# Patient Record
Sex: Female | Born: 1960 | Race: White | Hispanic: No | Marital: Married | Smoking: Never smoker
Health system: Southern US, Community
[De-identification: ages and names within clinical notes are randomized; demographics above are authoritative.]

## PROBLEM LIST (undated history)

## (undated) DIAGNOSIS — G4730 Sleep apnea, unspecified: Secondary | ICD-10-CM

## (undated) DIAGNOSIS — R002 Palpitations: Secondary | ICD-10-CM

## (undated) DIAGNOSIS — K219 Gastro-esophageal reflux disease without esophagitis: Secondary | ICD-10-CM

## (undated) DIAGNOSIS — I493 Ventricular premature depolarization: Secondary | ICD-10-CM

## (undated) HISTORY — PX: ENDOMETRIAL ABLATION: SHX621

---
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
## 1998-10-19 ENCOUNTER — Other Ambulatory Visit: Admission: RE | Admit: 1998-10-19 | Discharge: 1998-10-19 | Payer: Self-pay | Admitting: Cardiology

## 1999-10-24 ENCOUNTER — Other Ambulatory Visit: Admission: RE | Admit: 1999-10-24 | Discharge: 1999-10-24 | Payer: Self-pay | Admitting: Internal Medicine

## 1999-12-31 ENCOUNTER — Encounter: Payer: Self-pay | Admitting: Internal Medicine

## 1999-12-31 ENCOUNTER — Encounter: Admission: RE | Admit: 1999-12-31 | Discharge: 1999-12-31 | Payer: Self-pay | Admitting: Internal Medicine

## 2000-01-01 ENCOUNTER — Encounter: Payer: Self-pay | Admitting: Internal Medicine

## 2000-01-01 ENCOUNTER — Encounter: Admission: RE | Admit: 2000-01-01 | Discharge: 2000-01-01 | Payer: Self-pay | Admitting: Internal Medicine

## 2000-10-09 ENCOUNTER — Other Ambulatory Visit: Admission: RE | Admit: 2000-10-09 | Discharge: 2000-10-09 | Payer: Self-pay | Admitting: Internal Medicine

## 2000-11-18 ENCOUNTER — Encounter: Payer: Self-pay | Admitting: Internal Medicine

## 2000-11-18 ENCOUNTER — Encounter: Admission: RE | Admit: 2000-11-18 | Discharge: 2000-11-18 | Payer: Self-pay | Admitting: Internal Medicine

## 2001-01-15 ENCOUNTER — Encounter: Admission: RE | Admit: 2001-01-15 | Discharge: 2001-01-15 | Payer: Self-pay | Admitting: Internal Medicine

## 2001-01-15 ENCOUNTER — Encounter: Payer: Self-pay | Admitting: Internal Medicine

## 2001-10-22 ENCOUNTER — Other Ambulatory Visit: Admission: RE | Admit: 2001-10-22 | Discharge: 2001-10-22 | Payer: Self-pay | Admitting: Internal Medicine

## 2002-03-04 ENCOUNTER — Encounter: Payer: Self-pay | Admitting: Internal Medicine

## 2002-03-04 ENCOUNTER — Encounter: Admission: RE | Admit: 2002-03-04 | Discharge: 2002-03-04 | Payer: Self-pay | Admitting: Internal Medicine

## 2003-04-14 ENCOUNTER — Encounter: Admission: RE | Admit: 2003-04-14 | Discharge: 2003-04-14 | Payer: Self-pay | Admitting: Internal Medicine

## 2003-04-14 ENCOUNTER — Encounter: Payer: Self-pay | Admitting: Internal Medicine

## 2004-04-08 ENCOUNTER — Ambulatory Visit (HOSPITAL_COMMUNITY): Admission: RE | Admit: 2004-04-08 | Discharge: 2004-04-08 | Payer: Self-pay | Admitting: Gastroenterology

## 2004-04-08 ENCOUNTER — Encounter (INDEPENDENT_AMBULATORY_CARE_PROVIDER_SITE_OTHER): Payer: Self-pay | Admitting: *Deleted

## 2004-05-07 ENCOUNTER — Ambulatory Visit (HOSPITAL_COMMUNITY): Admission: RE | Admit: 2004-05-07 | Discharge: 2004-05-07 | Payer: Self-pay | Admitting: Gastroenterology

## 2004-09-13 ENCOUNTER — Ambulatory Visit (HOSPITAL_COMMUNITY): Admission: RE | Admit: 2004-09-13 | Discharge: 2004-09-13 | Payer: Self-pay | Admitting: Obstetrics & Gynecology

## 2004-09-13 ENCOUNTER — Encounter (INDEPENDENT_AMBULATORY_CARE_PROVIDER_SITE_OTHER): Payer: Self-pay | Admitting: *Deleted

## 2004-10-25 ENCOUNTER — Ambulatory Visit (HOSPITAL_COMMUNITY): Admission: RE | Admit: 2004-10-25 | Discharge: 2004-10-25 | Payer: Self-pay | Admitting: Internal Medicine

## 2005-11-28 ENCOUNTER — Ambulatory Visit (HOSPITAL_COMMUNITY): Admission: RE | Admit: 2005-11-28 | Discharge: 2005-11-28 | Payer: Self-pay | Admitting: Internal Medicine

## 2006-02-10 ENCOUNTER — Emergency Department (HOSPITAL_COMMUNITY): Admission: EM | Admit: 2006-02-10 | Discharge: 2006-02-11 | Payer: Self-pay | Admitting: Emergency Medicine

## 2006-12-02 ENCOUNTER — Ambulatory Visit (HOSPITAL_COMMUNITY): Admission: RE | Admit: 2006-12-02 | Discharge: 2006-12-02 | Payer: Self-pay | Admitting: Internal Medicine

## 2007-12-21 ENCOUNTER — Ambulatory Visit (HOSPITAL_COMMUNITY): Admission: RE | Admit: 2007-12-21 | Discharge: 2007-12-21 | Payer: Self-pay | Admitting: Internal Medicine

## 2009-12-07 ENCOUNTER — Ambulatory Visit (HOSPITAL_COMMUNITY): Admission: RE | Admit: 2009-12-07 | Discharge: 2009-12-07 | Payer: Self-pay | Admitting: Internal Medicine

## 2009-12-29 HISTORY — PX: HYSTEROSCOPY: SHX211

## 2010-12-27 ENCOUNTER — Ambulatory Visit (HOSPITAL_COMMUNITY)
Admission: RE | Admit: 2010-12-27 | Discharge: 2010-12-27 | Payer: Self-pay | Source: Home / Self Care | Attending: Internal Medicine | Admitting: Internal Medicine

## 2011-05-16 NOTE — Op Note (Signed)
NAME:  Shelly Moran, Shelly Moran                        ACCOUNT NO.:  [id number]   MEDICAL RECORD NO.:  [id number]                   PATIENT TYPE:  AMB   LOCATION:  ENDO                                 FACILITY:  [facility]   PHYSICIAN:  Anselmo Rod, M.D.               DATE OF BIRTH:  September 28, 1961   DATE OF PROCEDURE:  04/08/2004  DATE OF DISCHARGE:                                 OPERATIVE REPORT   PROCEDURE PERFORMED:  Colonoscopy with cold biopsies times six.   ENDOSCOPIST:  Charna Elizabeth, M.D.   INSTRUMENT USED:  Olympus video colonoscope.   INDICATIONS FOR PROCEDURE:  The patient is a 50 year old white female  undergoing colonoscopy for iron deficiency anemia.  Rule out colonic polyps,  masses, etc.   PREPROCEDURE PREPARATION:  Informed consent was procured from the patient.  The patient was fasted for eight hours prior to the procedure and prepped  with a bottle of magnesium citrate and a gallon of GoLYTELY the night prior  to the procedure.   PREPROCEDURE PHYSICAL:  The patient had stable vital signs.  Neck supple.  Chest clear to auscultation.  S1 and S2 regular.  Abdomen soft with normal  bowel sounds.   DESCRIPTION OF PROCEDURE:  The patient was placed in left lateral decubitus  position and sedated with an additional 20 mg of Demerol and 2 mg of Versed  intravenously.  Once the patient was adequately sedated and maintained on  low flow oxygen and continuous cardiac monitoring, the Olympus video  colonoscope was advanced from the cecum.  The terminal ileum appeared  healthy and without lesions.  Six small sessile polyps were biopsied from  the cecal base.  The appendicular orifice and ileocecal valve were clearly  visualized and photographed.  Retroflexion in the rectum revealed no  abnormalities.  There was no evidence of diverticulosis.  The patient  tolerated the procedure well without immediate complications.   IMPRESSION:  Six small sessile polyps biopsied from the cecal  base,  otherwise normal colonoscopy up to the terminal ileum.   RECOMMENDATIONS:  1. Await pathology results.  2. Avoid all nonsteroidals for now.  3. Resume iron supplements.  4. Outpatient followup in the next two weeks for further recommendations.                                               Anselmo Rod, M.D.    JNM/MEDQ  D:  04/08/2004  T:  04/08/2004  Job:  [id number]   cc:   Merlene Laughter. Renae Gloss, M.D.  [address]  [address]  [place]  [place] [address]  Fax: [phone]

## 2011-05-16 NOTE — Op Note (Signed)
NAME:  Shelly Moran, Shelly Moran                        ACCOUNT NO.:  [id number]   MEDICAL RECORD NO.:  [id number]                   PATIENT TYPE:  AMB   LOCATION:  ENDO                                 FACILITY:  [facility]   PHYSICIAN:  Anselmo Rod, M.D.               DATE OF BIRTH:  12/15/1961   DATE OF PROCEDURE:  04/08/2004  DATE OF DISCHARGE:                                 OPERATIVE REPORT   PROCEDURE:  Esophagogastroduodenoscopy with small bowel biopsies.   ENDOSCOPIST:  Anselmo Rod, M.D.   INSTRUMENT USED:  Olympus video panendoscope.   INDICATIONS FOR PROCEDURE:  [age]-year-old white female with a history  of iron deficiency anemia.  Rule out peptic ulcer disease, esophagitis,  gastritis, etc.   PRE-PROCEDURE PREPARATION:  Informed consent was procured from the patient.  The patient had fasted for eight hours prior to the procedure.   PRE-PROCEDURE PHYSICAL:  VITAL SIGNS:  Stable vital signs.  NECK:  Supple.  CHEST:  Clear to auscultation.  CARDIOVASCULAR:  S1 and S2 are regular.  ABDOMEN:  Soft with normal bowel sounds.   DESCRIPTION OF PROCEDURE:  The patient was placed in the left lateral  decubitus position and sedated with 60 mg of Demerol and 7 mg of Versed  intravenously.  Once the patient was adequately sedated and maintained on  low flow oxygen and continuous cardiac monitoring the Olympus video  panendoscope was advanced through the mouth piece, over the tongue and into  the esophagus under direct vision.  The entire esophagus appeared normal  with no evidence of ring, stricture, masses, esophagitis or Barrett's  mucosa.  A small hiatal hernia was seen on high retroflexion.  The rest of  the gastric mucosa in the proximal small bowel appeared normal.  Small bowel  biopsies were done to rule out celiac sprue given the patient's history of  iron deficiency anemia.   IMPRESSION:  Small hiatal hernia, otherwise normal  esophagogastroduodenoscopy.   RECOMMENDATIONS:  1. Await pathology results.  2. Avoid non-steroidals.  3. Proceed with colonoscopy at this time.  4. Further recommendations will be made thereafter.                                               Anselmo Rod, M.D.    JNM/MEDQ  D:  04/08/2004  T:  04/08/2004  Job:  [id number]   cc:   Merlene Laughter. Renae Gloss, M.D.  [address]  [address]  [place]  [place] [address]  Fax: [phone]

## 2011-05-16 NOTE — H&P (Signed)
NAME:  Shelly Moran, Shelly Moran NO.:  [id number]   MEDICAL RECORD NO.:  [id number]                   PATIENT TYPE:   LOCATION:                                       FACILITY:   PHYSICIAN:  Roseanna Rainbow, M.D.         DATE OF BIRTH:   DATE OF ADMISSION:  DATE OF DISCHARGE:                                HISTORY & PHYSICAL   CHIEF COMPLAINT:  The patient is a 50 year old Caucasian female with  menorrhagia who presents for [facility] hysteroscopy.   HISTORY OF PRESENT ILLNESS:  The patient gives a 76-month history of  menorrhagia.  She denies any intermenstrual bleeding or pain.  She also  denies any change in her bowel or bladder habits.  She has secondary anemia.  Workup to date has included an ultrasound from May 2005 that demonstrated a  thickened endometrial stripe approximately 1.9 cm.  She has a normal Pap  smear from March 2005.  She denies any history of any uterine pathology.   PAST OBSTETRICAL AND GYNECOLOGICAL HISTORY:  She is status post three NSVDs.  She has had one spontaneous abortion.   PAST MEDICAL HISTORY:  Iron deficiency anemia, GERD, arrhythmia, anxiety  disorder, depression.   PAST SURGICAL HISTORY:  T&A.   ALLERGIES:  No known drug allergies.   MEDICATIONS:  Paxil, Prevacid, Toprol-XL, and Clarinex.   FAMILY HISTORY:  Osteoporosis, myocardial infarction, hypertension.   SOCIAL HISTORY:  She is employed as an Government social research officer.  She is married.  She denies any tobacco, ethanol, or substance abuse.   PHYSICAL EXAMINATION:  VITAL SIGNS:  Temperature 98.5, pulse 70, blood  pressure 161/84, weight 167 pounds.  GENERAL:  Well-developed, well-nourished, no apparent distress.  LUNGS:  Clear to auscultation bilaterally.  HEART:  Regular rate and rhythm.  ABDOMEN:  Soft, nontender, no organomegaly.  PELVIC:  BUS within normal limits.  On speculum exam the vagina is clean.  On bimanual exam the uterus is small, anteverted, nontender.  The  adnexa are  nonpalpable and nontender.   ASSESSMENT:  Menorrhagia; rule out endometrial polyp, endometrial  hyperplasia, or a neoplastic process.   PLAN:  Diagnostic D&C hysteroscopy, possible operative hysteroscopy.  The  risks, benefits, and alternative forms of management were reviewed with the  patient and informed consent was obtained.                                               Roseanna Rainbow, M.D.    Judee Clara  D:  08/14/2004  T:  08/14/2004  Job:  [id number]

## 2011-05-16 NOTE — Op Note (Signed)
NAME:  Shelly Moran, Shelly Moran                        ACCOUNT NO.:  [id number]   MEDICAL RECORD NO.:  [id number]                   PATIENT TYPE:  AMB   LOCATION:  SDC                                  FACILITY:  [facility]   PHYSICIAN:  Roseanna Rainbow, M.D.         DATE OF BIRTH:  1961/06/24   DATE OF PROCEDURE:  09/13/2004  DATE OF DISCHARGE:                                 OPERATIVE REPORT   PREOPERATIVE DIAGNOSES:  Abnormal uterine bleeding.   POSTOPERATIVE DIAGNOSES:  Abnormal uterine bleeding.   PROCEDURE:  Diagnostic hysteroscopy and dilatation and curettage.   SURGEON:  Roseanna Rainbow, M.D.   ANESTHESIA:  Managed anesthesia care, paracervical block.   IV FLUIDS:  As per anesthesiology.   ESTIMATED BLOOD LOSS:  50 mL.   COMPLICATIONS:  None.   DESCRIPTION OF PROCEDURE:  The patient was taken to the operating room. She  was placed in the dorsal lithotomy position and prepped and draped in the  usual sterile fashion. Retractors were placed into the vagina. The anterior  lip of the cervix was infiltrated with 2 mL of 1% lidocaine.  The single  tooth tenaculum was then applied to this location.  5 mL of 1% lidocaine  were injected at 5 and 7 o'clock to produce a paracervical block. The cervix  was then dilated with [facility] dilators. The diagnostic hysteroscope was then  introduced into the cervical canal and into the uterine fundus.  A survey  was then taken of the uterine sidewalls and ostia.  The endometrium appeared  somewhat polypoid in appearance, however, there were no discreet lesions  noted.  Glycine was the distending medium used, there was a minimal deficit.  The hysteroscope was then removed.  Please note the uterus was retroverted.  The uterus sounded to 9 cm.  A sharp curettage was then performed.  The  single tooth tenaculum was then removed with minimal bleeding noted from the  cervix.  At the close of the procedure, the instrument and pack counts were  said to  be correct x2. The patient was taken to the PACU awake and in stable  condition.   PATHOLOGY:  Endometrial curettings.                                               Roseanna Rainbow, M.D.    Judee Clara  D:  09/13/2004  T:  09/14/2004  Job:  [id number]

## 2012-01-22 ENCOUNTER — Other Ambulatory Visit (HOSPITAL_COMMUNITY): Payer: Self-pay | Admitting: Internal Medicine

## 2012-01-22 DIAGNOSIS — Z1231 Encounter for screening mammogram for malignant neoplasm of breast: Secondary | ICD-10-CM

## 2012-02-20 ENCOUNTER — Ambulatory Visit (HOSPITAL_COMMUNITY)
Admission: RE | Admit: 2012-02-20 | Discharge: 2012-02-20 | Disposition: A | Discharge status: Home / Self Care | Source: Ambulatory Visit | Attending: Internal Medicine | Admitting: Internal Medicine

## 2012-02-20 DIAGNOSIS — Z1231 Encounter for screening mammogram for malignant neoplasm of breast: Secondary | ICD-10-CM | POA: Insufficient documentation

## 2012-02-20 IMAGING — MG MM DIGITAL SCREENING BILAT
6 series · 6 of 6 positions shown · non-contrast
Comparison: none

DG SCREEN MAMMOGRAM BILATERAL
Bilateral CC and MLO view(s) were taken.

DIGITAL SCREENING MAMMOGRAM WITH CAD:
The breast tissue is extremely dense.  No masses or malignant type calcifications are identified.  
Compared with prior studies.
Images were processed with CAD.

[R CC]
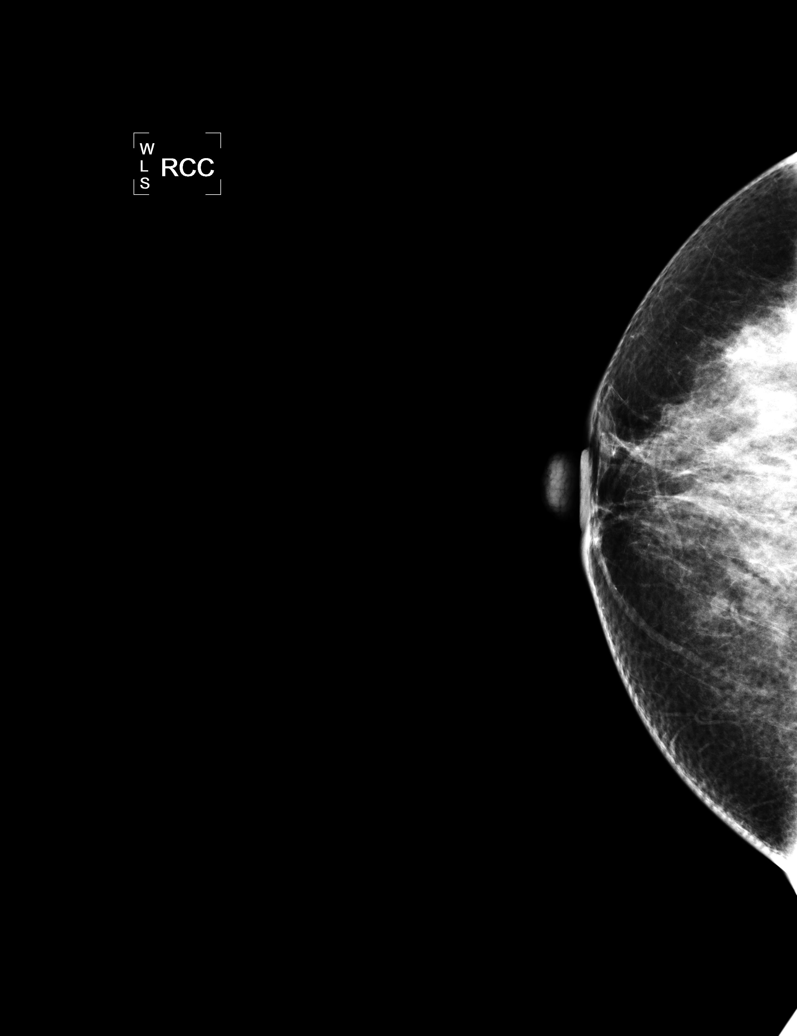

[R MLO (1 of 2)]
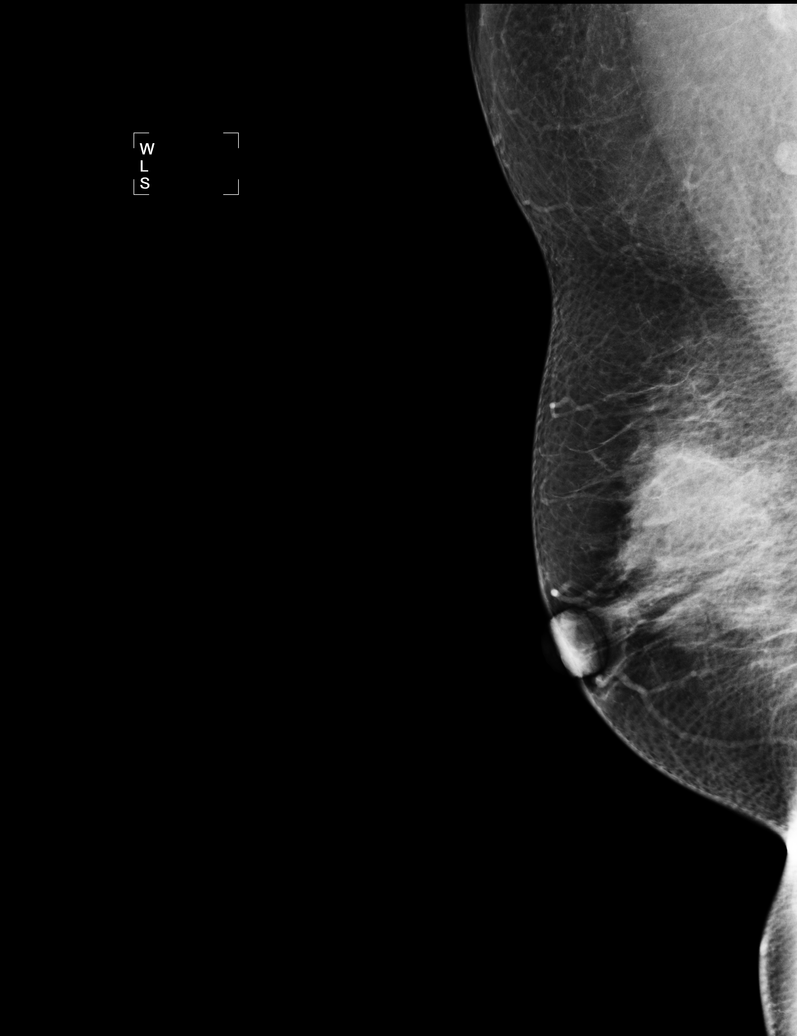

[L CC (1 of 2)]
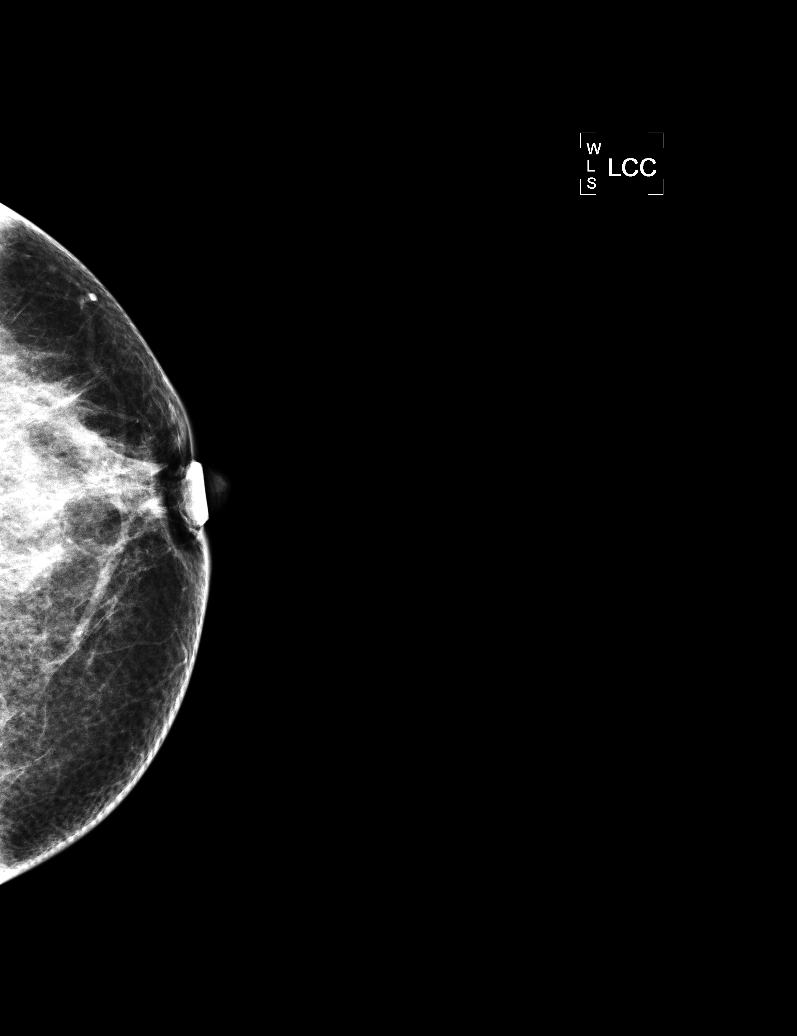

[L MLO]
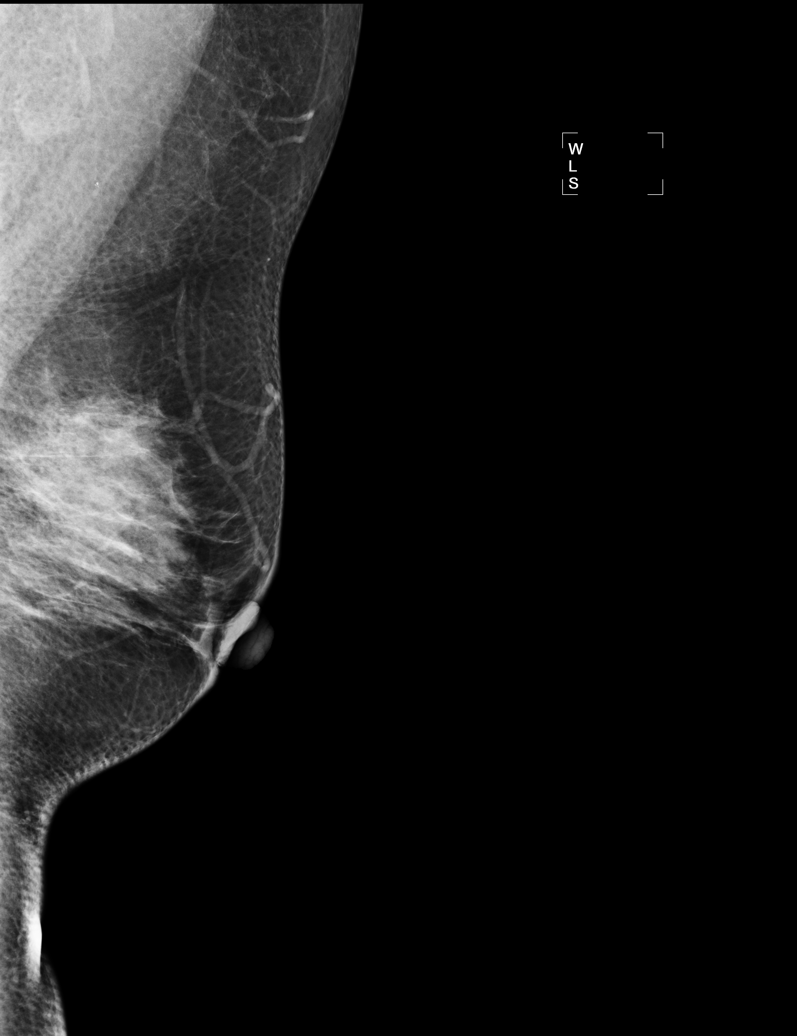

[L CC (2 of 2)]
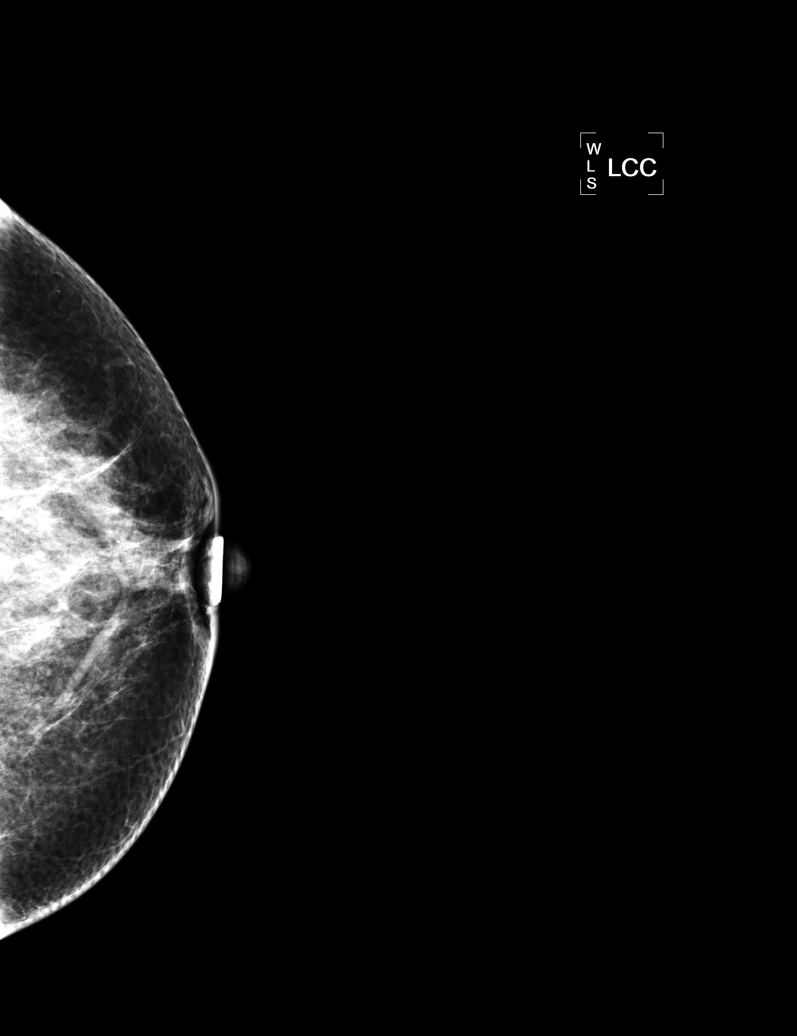

[R MLO (2 of 2)]
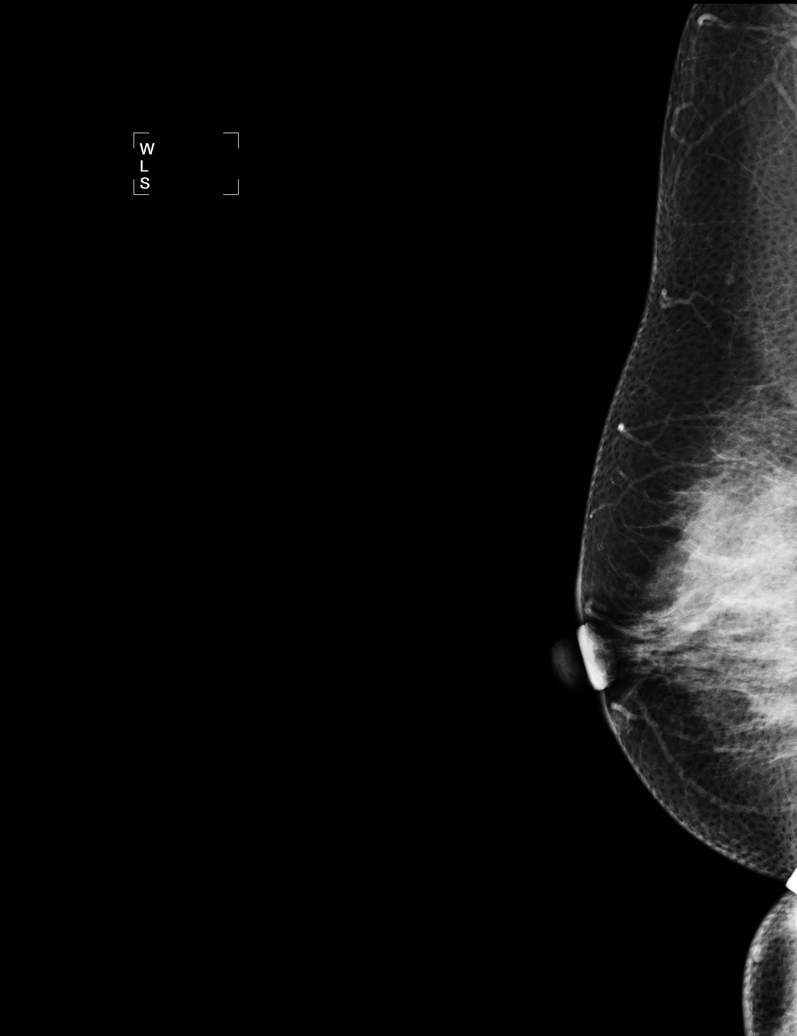

[6 of 6 positions shown; findings below may reference images not displayed]

IMPRESSION: No specific mammographic evidence of malignancy.  Next screening mammogram is recommended in one 
year.

A result letter of this screening mammogram will be mailed directly to the patient.

ASSESSMENT: Negative - BI-RADS 1

Screening mammogram and screening mammogram in 1 year.
,

## 2013-03-22 ENCOUNTER — Other Ambulatory Visit: Payer: Self-pay | Admitting: *Deleted

## 2013-03-25 ENCOUNTER — Other Ambulatory Visit (HOSPITAL_COMMUNITY): Payer: Self-pay | Admitting: Internal Medicine

## 2013-03-25 DIAGNOSIS — Z1231 Encounter for screening mammogram for malignant neoplasm of breast: Secondary | ICD-10-CM

## 2013-03-29 ENCOUNTER — Ambulatory Visit (HOSPITAL_COMMUNITY)

## 2013-03-30 ENCOUNTER — Ambulatory Visit (HOSPITAL_COMMUNITY)
Admission: RE | Admit: 2013-03-30 | Discharge: 2013-03-30 | Disposition: A | Discharge status: Home / Self Care | Source: Ambulatory Visit | Attending: Internal Medicine | Admitting: Internal Medicine

## 2013-03-30 DIAGNOSIS — Z1231 Encounter for screening mammogram for malignant neoplasm of breast: Secondary | ICD-10-CM

## 2013-07-13 ENCOUNTER — Ambulatory Visit (HOSPITAL_BASED_OUTPATIENT_CLINIC_OR_DEPARTMENT_OTHER): Attending: Internal Medicine | Admitting: Radiology

## 2013-07-13 VITALS — Ht 68.0 in | Wt 169.0 lb

## 2013-07-13 DIAGNOSIS — G4710 Hypersomnia, unspecified: Secondary | ICD-10-CM

## 2013-07-13 DIAGNOSIS — R0683 Snoring: Secondary | ICD-10-CM

## 2013-07-13 DIAGNOSIS — G4733 Obstructive sleep apnea (adult) (pediatric): Secondary | ICD-10-CM | POA: Insufficient documentation

## 2013-07-16 DIAGNOSIS — R0989 Other specified symptoms and signs involving the circulatory and respiratory systems: Secondary | ICD-10-CM

## 2013-07-16 DIAGNOSIS — G4733 Obstructive sleep apnea (adult) (pediatric): Secondary | ICD-10-CM

## 2013-07-16 DIAGNOSIS — R0609 Other forms of dyspnea: Secondary | ICD-10-CM

## 2013-07-16 NOTE — Procedures (Signed)
NAME:  Shelly Moran, SOZA              ACCOUNT NO.:  [id number]  MEDICAL RECORD NO.:  [id number]          PATIENT TYPE:  OUT  LOCATION:  SLEEP CENTER                 FACILITY:  [facility]  PHYSICIAN:  Iman Reinertsen D. Maple Hudson, MD, FCCP, FACPDATE OF BIRTH:  [birth year]  DATE OF STUDY:  07/13/2013                           NOCTURNAL POLYSOMNOGRAM  REFERRING PHYSICIAN:  SCOTT HOLWERDA  INDICATION FOR STUDY:  Hypersomnia with sleep apnea.  EPWORTH SLEEPINESS SCORE:  14/24.  BMI 25.7, weight 169 pounds, height 68 inches, neck 14.5 inches.  MEDICATIONS:  Home medications are charted for review.  SLEEP ARCHITECTURE:  Total sleep time 214.5 minutes with sleep efficiency 56.7%.  Stage I was 30.1%, stage II 69.2%, stage III was absent, REM 0.7% of total sleep time.  Sleep latency 57.5 minutes, REM latency 204.5 minutes.  Awake after sleep onset 107.5 minutes.  Arousal index 35.8.  Bedtime Medication:  None.  Sleep pattern was marked by difficulty initiating and maintaining sleep until nearly 1:30 a.m. and by frequent spontaneous awakenings thereafter.  RESPIRATORY DATA:  Apnea-hypopnea index (AHI) 31.6 per hour.  A total of 113 events was scored including 64 obstructive apneas, 2 mixed apneas, 47 hypopneas.  Events were more frequent during supine sleep position and during REM.  REM/AHI 40 per hour.  This study was ordered as a diagnostic NPSG protocol without CPAP.  Most events occurred after 2 a.m. when she finally began to sustain sleep.  OXYGEN DATA:  Moderately loud snoring with oxygen desaturation to a nadir of 86% and mean oxygen saturation through the study of 95.6% on room air.  CARDIAC DATA:  Sinus rhythm with rare PVC.  MOVEMENT-PARASOMNIA:  No significant movement disturbance.  Bathroom x1.  IMPRESSIONS-RECOMMENDATIONS: 1. Difficulty initiating and maintaining sleep until after 1:30 a.m.     without bedtime medication. 2. Severe obstructive sleep apnea/hypopnea syndrome, AHI 31.6 per   hour.  Events in all sleep positions, but especially while supine.     REM/AHI 40 per hour.  Moderately loud snoring with oxygen     desaturation to a nadir of 86% and mean oxygen saturation through     the study of 95.6% on room air. 3. Scores in this range are usually addressed first with CPAP.  She     can return for a dedicated CPAP titration study if appropriate.     Levorn Oleski D. Maple Hudson, MD, [facility], FACP Diplomate, American Board of Sleep Medicine    CDY/MEDQ  D:  07/16/2013 10:51:59  T:  07/16/2013 14:05:29  Job:  [id number]

## 2013-11-17 ENCOUNTER — Ambulatory Visit (HOSPITAL_BASED_OUTPATIENT_CLINIC_OR_DEPARTMENT_OTHER): Attending: Internal Medicine | Admitting: Radiology

## 2013-11-17 VITALS — Ht 68.0 in | Wt 169.0 lb

## 2013-11-17 DIAGNOSIS — G4710 Hypersomnia, unspecified: Secondary | ICD-10-CM | POA: Insufficient documentation

## 2013-11-17 DIAGNOSIS — G4730 Sleep apnea, unspecified: Secondary | ICD-10-CM

## 2013-11-17 DIAGNOSIS — R0683 Snoring: Secondary | ICD-10-CM

## 2013-11-20 NOTE — Procedures (Signed)
NAME:  Shelly Moran, Shelly Moran              ACCOUNT NO.:  [id number]  MEDICAL RECORD NO.:  [id number]          PATIENT TYPE:  OUT  LOCATION:  SLEEP CENTER                 FACILITY:  [facility]  PHYSICIAN:  Clinton D. Maple Hudson, MD, FCCP, FACPDATE OF BIRTH:  [birth year]  DATE OF STUDY:  11/17/2013                           NOCTURNAL POLYSOMNOGRAM  REFERRING PHYSICIAN:  SCOTT HOLWERDA  INDICATION FOR STUDY:  Hypersomnia with sleep apnea.  EPWORTH SLEEPINESS SCORE:  14/24.  BMI 25.7, weight 169 pounds, height 68 inches, neck 16 inches.  MEDICATIONS:  Home medications are charted for review.  A baseline diagnostic NPSG on July 13, 2013 recorded AHI 31.6 per hour with body weight 169 pounds.  CPAP titration is now requested.  SLEEP ARCHITECTURE:  Total sleep time 223 minutes with sleep efficiency 56.2%.  Stage I was 14.8%, stage II 77.4%, stage III 0.4%.  REM 7.4% of total sleep time.  Sleep latency 51.5 minutes, REM latency 153.5 minutes.  Awake after sleep onset 120.5 minutes, arousal index 25.8.  BEDTIME MEDICATIONS:  Metoprolol.  RESPIRATORY DATA:  CPAP titration protocol.  CPAP was titrated to 10 CWP, AHI 1 per hour.  She wore a medium [employer] Simplus full face mask with heated humidifier.  OXYGEN DATA:  Snoring was prevented and mean oxygen saturation held 95.7% on room air with CPAP.  CARDIAC DATA:  Sinus rhythm with occasional PVC and PAC.  MOVEMENT/PARASOMNIA:  No significant movement disturbance.  Bathroom x2.  IMPRESSION/RECOMMENDATION: 1. Successful CPAP titration to 10 CWP, AHI 1 per hour.  She wore a     medium [employer] Simplus full face mask with heated     humidifier.  Snoring was prevented and mean oxygen saturation held     95.7% on room air with CPAP. 2. A baseline diagnostic NPSG on July 13, 2013 had recorded AHI 31.6     per hour.  Body weight was 169 pounds for that study.     Clinton D. Maple Hudson, MD, [facility], FACP Diplomate, American Board of Sleep  Medicine    CDY/MEDQ  D:  11/20/2013 12:00:25  T:  11/20/2013 14:07:21  Job:  [id number]

## 2013-11-21 DIAGNOSIS — R0609 Other forms of dyspnea: Secondary | ICD-10-CM

## 2013-11-21 DIAGNOSIS — R0989 Other specified symptoms and signs involving the circulatory and respiratory systems: Secondary | ICD-10-CM

## 2013-11-21 DIAGNOSIS — G4730 Sleep apnea, unspecified: Secondary | ICD-10-CM

## 2014-04-28 ENCOUNTER — Other Ambulatory Visit (HOSPITAL_COMMUNITY): Payer: Self-pay | Admitting: Obstetrics and Gynecology

## 2014-04-28 DIAGNOSIS — Z1231 Encounter for screening mammogram for malignant neoplasm of breast: Secondary | ICD-10-CM

## 2014-05-08 ENCOUNTER — Ambulatory Visit (HOSPITAL_COMMUNITY)
Admission: RE | Admit: 2014-05-08 | Discharge: 2014-05-08 | Disposition: A | Discharge status: Home / Self Care | Source: Ambulatory Visit | Attending: Obstetrics and Gynecology | Admitting: Obstetrics and Gynecology

## 2014-05-08 DIAGNOSIS — Z1231 Encounter for screening mammogram for malignant neoplasm of breast: Secondary | ICD-10-CM | POA: Insufficient documentation

## 2015-06-11 ENCOUNTER — Other Ambulatory Visit (HOSPITAL_COMMUNITY): Payer: Self-pay | Admitting: Obstetrics and Gynecology

## 2015-06-11 DIAGNOSIS — Z1231 Encounter for screening mammogram for malignant neoplasm of breast: Secondary | ICD-10-CM

## 2015-06-18 ENCOUNTER — Ambulatory Visit (HOSPITAL_COMMUNITY)
Admission: RE | Admit: 2015-06-18 | Discharge: 2015-06-18 | Disposition: A | Discharge status: Home / Self Care | Source: Ambulatory Visit | Attending: Obstetrics and Gynecology | Admitting: Obstetrics and Gynecology

## 2015-06-18 DIAGNOSIS — Z1231 Encounter for screening mammogram for malignant neoplasm of breast: Secondary | ICD-10-CM

## 2015-10-17 ENCOUNTER — Ambulatory Visit: Admitting: Podiatry

## 2015-10-22 ENCOUNTER — Ambulatory Visit (INDEPENDENT_AMBULATORY_CARE_PROVIDER_SITE_OTHER)

## 2015-10-22 ENCOUNTER — Ambulatory Visit (INDEPENDENT_AMBULATORY_CARE_PROVIDER_SITE_OTHER): Admitting: Podiatry

## 2015-10-22 VITALS — BP 140/77 | HR 57 | Resp 16 | Ht 67.0 in | Wt 174.0 lb

## 2015-10-22 DIAGNOSIS — M79672 Pain in left foot: Secondary | ICD-10-CM | POA: Diagnosis not present

## 2015-10-22 DIAGNOSIS — M79671 Pain in right foot: Secondary | ICD-10-CM | POA: Diagnosis not present

## 2015-10-22 DIAGNOSIS — M205X9 Other deformities of toe(s) (acquired), unspecified foot: Secondary | ICD-10-CM

## 2015-10-22 DIAGNOSIS — M779 Enthesopathy, unspecified: Secondary | ICD-10-CM

## 2015-10-22 MED ORDER — METHYLPREDNISOLONE 4 MG PO TBPK: ORAL_TABLET | ORAL | Status: DC

## 2015-10-22 MED ORDER — TRIAMCINOLONE ACETONIDE 10 MG/ML IJ SUSP
10.0000 mg | Freq: Once | INTRAMUSCULAR | Status: AC
  Administered 2015-10-22: 10 mg

## 2015-10-22 NOTE — Progress Notes (Signed)
   Subjective:    Patient ID: Shelly Moran, female    DOB: 10/28/1961, 54 y.o.   MRN: [id number]  HPI Patient presents with foot pain in their left foot, great toe radiating to top of foot. Pt stated, "can feel pain sometimes between 2-4th toes". Pain is worse after exercising; x3 months.   Review of Systems  All other systems reviewed and are negative.      Objective:   Physical Exam        Assessment & Plan:

## 2015-10-24 NOTE — Progress Notes (Signed)
Subjective:     Patient ID: Shelly Moran, female   DOB: 02/10/1961, 54 y.o.   MRN: [id number]  HPI patient presents with quite a bit of discomfort in the forefoot left that has been going on for around 3 months. Patient states that she's tried to reduce activity and different treatment options without relief of symptoms   Review of Systems  All other systems reviewed and are negative.      Objective:   Physical Exam  Constitutional: She is oriented to person, place, and time.  Cardiovascular: Intact distal pulses.   Musculoskeletal: Normal range of motion.  Neurological: She is oriented to person, place, and time.  Skin: Skin is warm.  Nursing note and vitals reviewed.  neurovascular status found to be intact muscle strength adequate range of motion within normal limits with patient noted to have forefoot inflammation around the second MPJ left and also around the lateral side of the first MPJ with reduced range of motion of the first MPJ noted. 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 Reappoint for us to recheck

## 2015-11-12 ENCOUNTER — Ambulatory Visit (INDEPENDENT_AMBULATORY_CARE_PROVIDER_SITE_OTHER): Admitting: Podiatry

## 2015-11-12 DIAGNOSIS — M779 Enthesopathy, unspecified: Secondary | ICD-10-CM

## 2015-11-12 DIAGNOSIS — M205X9 Other deformities of toe(s) (acquired), unspecified foot: Secondary | ICD-10-CM

## 2015-11-12 NOTE — Patient Instructions (Signed)

## 2015-11-14 NOTE — Progress Notes (Signed)
Subjective:     Patient ID: Shelly Moran, female   DOB: 01/14/1961, 54 y.o.   MRN: [id number]  HPI patient states I'm still having some pain in this joint and I know ultimately I'll need to have it fixed but it is better than it was and I want to try the orthotics   Review of Systems     Objective:   Physical Exam Neurovascular status intact muscle strength adequate with discomfort of a minimal nature left first MPJ currently with fluid buildup noted but significant reduction of pain    Assessment:     Improved with capsular injection    Plan:     Orthotics dispensed with instructions on usage and patient be seen back and understands eventually will probably need surgical intervention in this particular case

## 2015-12-30 HISTORY — PX: FOOT SURGERY: SHX648

## 2016-01-07 ENCOUNTER — Ambulatory Visit: Admitting: Podiatry

## 2016-01-14 ENCOUNTER — Ambulatory Visit: Admitting: Podiatry

## 2016-01-21 ENCOUNTER — Encounter: Payer: Self-pay | Admitting: Podiatry

## 2016-01-21 ENCOUNTER — Ambulatory Visit (INDEPENDENT_AMBULATORY_CARE_PROVIDER_SITE_OTHER): Admitting: Podiatry

## 2016-01-21 VITALS — BP 138/75 | HR 63 | Resp 16

## 2016-01-21 DIAGNOSIS — M205X9 Other deformities of toe(s) (acquired), unspecified foot: Secondary | ICD-10-CM | POA: Diagnosis not present

## 2016-01-21 NOTE — Patient Instructions (Signed)
Pre-Operative Instructions  Congratulations, you have decided to take an important step to improving your quality of life.  You can be assured that the doctors of [facility] will be with you every step of the way.  1. Plan to be at the surgery center/hospital at least 1 (one) hour prior to your scheduled time unless otherwise directed by the surgical center/hospital staff.  You must have a responsible adult accompany you, remain during the surgery and drive you home.  Make sure you have directions to the surgical center/hospital and know how to get there on time. 2. For hospital based surgery you will need to obtain a history and physical form from your family physician within 1 month prior to the date of surgery- we will give you a form for you primary physician.  3. We make every effort to accommodate the date you request for surgery.  There are however, times where surgery dates or times have to be moved.  We will contact you as soon as possible if a change in schedule is required.   4. No Aspirin/Ibuprofen for one week before surgery.  If you are on aspirin, any non-steroidal anti-inflammatory medications (Mobic, Aleve, Ibuprofen) you should stop taking it 7 days prior to your surgery.  You make take Tylenol  For pain prior to surgery.  5. Medications- If you are taking daily heart and blood pressure medications, seizure, reflux, allergy, asthma, anxiety, pain or diabetes medications, make sure the surgery center/hospital is aware before the day of surgery so they may notify you which medications to take or avoid the day of surgery. 6. No food or drink after midnight the night before surgery unless directed otherwise by surgical center/hospital staff. 7. No alcoholic beverages 24 hours prior to surgery.  No smoking 24 hours prior to or 24 hours after surgery. 8. Wear loose pants or shorts- loose enough to fit over bandages, boots, and casts. 9. No slip on shoes, sneakers are best. 10. Bring  your boot with you to the surgery center/hospital.  Also bring crutches or a walker if your physician has prescribed it for you.  If you do not have this equipment, it will be provided for you after surgery. 11. If you have not been contracted by the surgery center/hospital by the day before your surgery, call to confirm the date and time of your surgery. 12. Leave-time from work may vary depending on the type of surgery you have.  Appropriate arrangements should be made prior to surgery with your employer. 13. Prescriptions will be provided immediately following surgery by your doctor.  Have these filled as soon as possible after surgery and take the medication as directed. 14. Remove nail polish on the operative foot. 15. Wash the night before surgery.  The night before surgery wash the foot and leg well with the antibacterial soap provided and water paying special attention to beneath the toenails and in between the toes.  Rinse thoroughly with water and dry well with a towel.  Perform this wash unless told not to do so by your physician.  Enclosed: 1 Ice pack (please put in freezer the night before surgery)   1 Hibiclens skin cleaner   Pre-op Instructions  If you have any questions regarding the instructions, do not hesitate to call our office.  [place]: [address] , [place] [address] [phone]  [place]: [address] [place], [place] [address] [phone]  [place]: [address]  [place], [place] [address] [phone]  Dr. Richard   Tuchman DPM, Dr. Norman Regal DPM Dr. Richard Sikora DPM, Dr. M. Todd Hyatt DPM, Dr. Kathryn Egerton DPM 

## 2016-01-22 ENCOUNTER — Telehealth: Payer: Self-pay | Admitting: *Deleted

## 2016-01-22 NOTE — Telephone Encounter (Signed)
"  I was there yesterday.  I scheduled surgery with you for 02/05/16.  You said he does surgery on Tuesdays and Thursdays correct?"  No, he only does surgery on Tuesdays.  "Oh, okay well disregard my call.  I'll leave surgery where it is.  I was going to ask you to move it to the 9th but just leave it where it is."

## 2016-01-22 NOTE — Progress Notes (Signed)
Subjective:     Patient ID: Shelly Moran, female   DOB: 02-14-1961, 54 y.o.   MRN: [id number]  HPI patient states this left big toe joint is really bothering her despite orthotics and previous injections and she states she wants it fixed   Review of Systems     Objective:   Physical Exam  neurovascular status intact muscle strength adequate with significant pain first MPJ left with significant range of motion loss and spur dorsally noted    Assessment:      severe hallux limitus with inflammatory condition left with failure to respond to conservative care    Plan:      x-rays reviewed with her and husband and discussed osteotomy with removal of spurs and the fact that ultimately may require implant or joint implant or possible fusion and that I may end up doing an implant at the time of the initial surgery if the cartilage is bad. They want surgery and I allowed him to read consent form reviewing alternative treatments complications and patient is scheduled and signs consent form after review. Total recovery will take approximate 6 months and air fracture walker dispensed at this time

## 2016-01-29 ENCOUNTER — Telehealth: Payer: Self-pay | Admitting: *Deleted

## 2016-01-29 NOTE — Telephone Encounter (Signed)
"  I'm scheduled for surgery on 02/07.  I have some FMLA papers that I need to fill out."  You're need to contact Hurricane.  She takes care of FMLA.  She can be reached at [phone].  "Okay, thank you."

## 2016-02-05 ENCOUNTER — Encounter: Payer: Self-pay | Admitting: Podiatry

## 2016-02-05 DIAGNOSIS — M2012 Hallux valgus (acquired), left foot: Secondary | ICD-10-CM | POA: Diagnosis not present

## 2016-02-06 ENCOUNTER — Telehealth: Payer: Self-pay | Admitting: *Deleted

## 2016-02-06 NOTE — Telephone Encounter (Addendum)
Pt states had surgery yesterday with Dr. Charlsie Merles, and had questions.  Pt asked if the dressing stayed in place until the next visit and when was the next visit.  I told pt she was to be in that dressing until after her 1st POV, and it was 02/15/2016 at 0845am.  02/25/2016-PT STATES GAVE RETURN TO WORK paperwork to Dr. Beverlee Nims tall nurse and was told she would take care of it.  Pt states she would like to pick it up tomorrow, and have it states he can return to work on Monday 03/03/2016 at 1/2 day Monday and Tuesday with full duty Wednesday.

## 2016-02-15 ENCOUNTER — Ambulatory Visit (INDEPENDENT_AMBULATORY_CARE_PROVIDER_SITE_OTHER): Admitting: Podiatry

## 2016-02-15 ENCOUNTER — Ambulatory Visit (INDEPENDENT_AMBULATORY_CARE_PROVIDER_SITE_OTHER)

## 2016-02-15 VITALS — Temp 96.5°F

## 2016-02-15 DIAGNOSIS — M205X9 Other deformities of toe(s) (acquired), unspecified foot: Secondary | ICD-10-CM | POA: Diagnosis not present

## 2016-02-15 DIAGNOSIS — Z9889 Other specified postprocedural states: Secondary | ICD-10-CM | POA: Diagnosis not present

## 2016-02-18 NOTE — Progress Notes (Signed)
Subjective:     Patient ID: Shelly Moran, female   DOB: 1961-04-21, 55 y.o.   MRN: [id number]  HPI patient states I'm doing well with mild swelling and discomfort if I do a lot of walking   Review of Systems     Objective:   Physical Exam Neurovascular status intact negative Homan sign was noted and wound edges are coapted well left first MPJ. Range of motion is adequate but still lacking some dorsiflexion but I did not note any crepitus    Assessment:     Doing well post osteotomy first metatarsal left with good positional component noted and good range of motion for this. Postop    Plan:     Reviewed condition and at this point I have recommended above ankle bracing with strapping to plantarflexed the hallux. Gave instructions on continued range of motion exercises and reappoint to recheck

## 2016-03-07 ENCOUNTER — Encounter: Payer: Self-pay | Admitting: Podiatry

## 2016-03-07 ENCOUNTER — Ambulatory Visit (INDEPENDENT_AMBULATORY_CARE_PROVIDER_SITE_OTHER)

## 2016-03-07 ENCOUNTER — Ambulatory Visit (INDEPENDENT_AMBULATORY_CARE_PROVIDER_SITE_OTHER): Admitting: Podiatry

## 2016-03-07 VITALS — BP 151/82 | HR 68 | Resp 16

## 2016-03-07 DIAGNOSIS — M205X9 Other deformities of toe(s) (acquired), unspecified foot: Secondary | ICD-10-CM | POA: Diagnosis not present

## 2016-03-07 DIAGNOSIS — Z9889 Other specified postprocedural states: Secondary | ICD-10-CM

## 2016-03-08 NOTE — Progress Notes (Signed)
Subjective:     Patient ID: Shelly Moran, female   DOB: 03/12/1961, 55 y.o.   MRN: [id number]  HPI patient states I'm doing well with my surgery but I'm still getting occasional stating the pain to 5 done to much   Review of Systems     Objective:   Physical Exam Neurovascular status intact with excellent range of motion first MPJ left with no current crepitus with mild edema and wound edges that are well coapted first metatarsal with no restriction of motion noted    Assessment:     Doing well clinically from hallux limitus repair left    Plan:     Reviewed x-rays indicating there is slight changes on the lateral side but there is no clinical symptoms indicating fracture or other pathology. We will continue to watch this and she may continue to be active and is given encouragement on range of motion exercises  X-ray report indicates the pins are in good position the osteotomy is healing well and there is a small amount of change around the base of phalanx lateral side left hallux but no indications clinically that it's causing her any problems and so at this time it we'll just be watched

## 2016-04-18 ENCOUNTER — Other Ambulatory Visit

## 2016-04-21 ENCOUNTER — Ambulatory Visit: Payer: Self-pay

## 2016-04-21 ENCOUNTER — Ambulatory Visit (INDEPENDENT_AMBULATORY_CARE_PROVIDER_SITE_OTHER): Admitting: Podiatry

## 2016-04-21 DIAGNOSIS — M205X9 Other deformities of toe(s) (acquired), unspecified foot: Secondary | ICD-10-CM

## 2016-04-21 DIAGNOSIS — Z9889 Other specified postprocedural states: Secondary | ICD-10-CM

## 2016-04-23 NOTE — Progress Notes (Signed)
Subjective:     Patient ID: Shelly Moran, female   DOB: 03/02/1961, 55 y.o.   MRN: [id number]  HPI patient states I'm doing well with my foot   Review of Systems     Objective:   Physical Exam Neurovascular status intact with good range of motion no crepitus of the joint and good alignment noted    Assessment:     Doing well post hallux limitus    Plan:     Advised on the importance of range of motion activities and patient will be seen back to recheck

## 2016-07-02 ENCOUNTER — Other Ambulatory Visit: Payer: Self-pay | Admitting: Obstetrics and Gynecology

## 2016-07-02 DIAGNOSIS — Z1231 Encounter for screening mammogram for malignant neoplasm of breast: Secondary | ICD-10-CM

## 2016-07-10 ENCOUNTER — Ambulatory Visit
Admission: RE | Admit: 2016-07-10 | Discharge: 2016-07-10 | Disposition: A | Discharge status: Home / Self Care | Source: Ambulatory Visit | Attending: Obstetrics and Gynecology | Admitting: Obstetrics and Gynecology

## 2016-07-10 DIAGNOSIS — Z1231 Encounter for screening mammogram for malignant neoplasm of breast: Secondary | ICD-10-CM

## 2016-08-22 NOTE — Progress Notes (Signed)
DOS 02/05/2016 Bi-PLantar osteotomy e/pin fixation left 1st MPJ w/removal bone spur

## 2017-09-16 ENCOUNTER — Other Ambulatory Visit: Payer: Self-pay | Admitting: Obstetrics and Gynecology

## 2017-09-16 DIAGNOSIS — Z1231 Encounter for screening mammogram for malignant neoplasm of breast: Secondary | ICD-10-CM

## 2017-09-25 ENCOUNTER — Ambulatory Visit
Admission: RE | Admit: 2017-09-25 | Discharge: 2017-09-25 | Disposition: A | Discharge status: Home / Self Care | Source: Ambulatory Visit | Attending: Obstetrics and Gynecology | Admitting: Obstetrics and Gynecology

## 2017-09-25 DIAGNOSIS — Z1231 Encounter for screening mammogram for malignant neoplasm of breast: Secondary | ICD-10-CM

## 2018-03-03 ENCOUNTER — Other Ambulatory Visit: Payer: Self-pay | Admitting: Family Medicine

## 2018-03-03 DIAGNOSIS — M545 Low back pain, unspecified: Secondary | ICD-10-CM

## 2018-03-09 ENCOUNTER — Ambulatory Visit
Admission: RE | Admit: 2018-03-09 | Discharge: 2018-03-09 | Disposition: A | Discharge status: Home / Self Care | Source: Ambulatory Visit | Attending: Family Medicine | Admitting: Family Medicine

## 2018-03-09 DIAGNOSIS — M545 Low back pain, unspecified: Secondary | ICD-10-CM

## 2018-05-11 ENCOUNTER — Encounter: Payer: Self-pay | Admitting: Physical Therapy

## 2018-05-11 ENCOUNTER — Other Ambulatory Visit: Payer: Self-pay

## 2018-05-11 ENCOUNTER — Ambulatory Visit: Attending: Physical Medicine and Rehabilitation | Admitting: Physical Therapy

## 2018-05-11 DIAGNOSIS — G8929 Other chronic pain: Secondary | ICD-10-CM | POA: Diagnosis present

## 2018-05-11 DIAGNOSIS — M545 Low back pain, unspecified: Secondary | ICD-10-CM

## 2018-05-11 DIAGNOSIS — R293 Abnormal posture: Secondary | ICD-10-CM | POA: Insufficient documentation

## 2018-05-11 NOTE — Therapy (Signed)
[facility] [address] [place], [place], [address] Phone: [phone]   Fax:  [phone]  Physical Therapy Evaluation  Patient Details  Name: Shelly Moran MRN: [id number] Date of Birth: [birth year] Referring Provider: Sheran Luz MD   Encounter Date: 05/11/2018  PT End of Session - 05/11/18 1638    Visit Number  1    Number of Visits  12    Date for PT Re-Evaluation  06/08/18    PT Start Time  0318    PT Stop Time  0419    PT Time Calculation (min)  61 min    Activity Tolerance  Patient tolerated treatment well    Behavior During Therapy  [facility] for tasks assessed/performed       History reviewed. No pertinent past medical history.  History reviewed. No pertinent surgical history.  There were no vitals filed for this visit.   Subjective Assessment - 05/11/18 1657    Subjective  The patient reports a 10 year history of recurring low back pain.  She states her back was feeling quite good until January of this year when she bent over her dishwasher and felt intense left sided low back pain and muscle spasms.  She states that usually chiropratic care has helped in the past but this time it did not.  She rates her pain at a low 2/10 today but her pain while rise to a 6/10 with bending and prolonged standing.  Medications decrease her pain.    Pertinent History  Long h/o low back pain.    Limitations  Standing    How long can you stand comfortably?  15-20 minutes.    Diagnostic tests  MRI.    Patient Stated Goals  Get out of pain.    Currently in Pain?  Yes    Pain Score  2     Pain Location  Back    Pain Orientation  Left    Pain Descriptors / Indicators  Aching;Dull;Discomfort    Pain Type  Acute pain    Pain Onset  More than a month ago    Pain Frequency  Constant    Aggravating Factors   See above.    Pain Relieving Factors  Medication.  Spinal injection.         [facility] PT Assessment - 05/11/18 0001      Assessment    Medical Diagnosis  Degeneration of lumbar intervertebral disc.    Referring Provider  Sheran Luz MD    Onset Date/Surgical Date  -- January 2019.      Precautions   Precautions  None      Restrictions   Weight Bearing Restrictions  No      Balance Screen   Has the patient fallen in the past 6 months  No    Has the patient had a decrease in activity level because of a fear of falling?   No    Is the patient reluctant to leave their home because of a fear of falling?   No      Home Public house manager residence      Prior Function   Level of Independence  Independent      Posture/Postural Control   Posture/Postural Control  Postural limitations    Postural Limitations  Increased thoracic kyphosis    Posture Comments  Mild upper lumbar scoliosis with convexity on left.      ROM / Strength   AROM /  PROM / Strength  AROM;Strength      AROM   Overall AROM Comments  Normal lumbar intervertebral movement into flexion and extension= 15 degrees.      Strength   Overall Strength Comments  Normal bilateral LE strength.      Palpation   Palpation comment  Tender to palpation over left QL at L3-4.  Very significant tone in this region.      Special Tests   Other special tests  Normal LE DTR's; (=) leg lengths; (-) bilateral SLR; (+) left FABER test.      Bed Mobility   Bed Mobility  -- Independent.      Ambulation/Gait   Gait Comments  WNL.                Objective measurements completed on examination: See above findings.      [facility] Adult PT Treatment/Exercise - 05/11/18 0001      Modalities   Modalities  Electrical Stimulation;Moist Heat      Moist Heat Therapy   Number Minutes Moist Heat  20 Minutes    Moist Heat Location  Lumbar Spine      Electrical Stimulation   Electrical Stimulation Location  Low back.    Electrical Stimulation Action  IFC    Electrical Stimulation Parameters  80-150 Hz x 20 minutes.    Electrical  Stimulation Goals  Tone;Pain                  PT Long Term Goals - 05/11/18 1721      PT LONG TERM GOAL #1   Title  Independent with an HEP.    Time  4    Period  Weeks    Status  New      PT LONG TERM GOAL #2   Title  Stand 30 minutes with pain not > 2-3/10.    Time  8    Period  Weeks    Status  New      PT LONG TERM GOAL #3   Title  Perform ADL's with pain not > 3/10.    Time  8    Period  Weeks    Status  New             Plan - 05/11/18 1713    Clinical Impression Statement  The patient presents to OPPT with a long h/o low back pain and recent exacerbation in January of this year.  She had an injection which helped.  Her CC is that of left sided low back pain and she was found to have a trigger in her left QL region.  Her pain limits her ability to stand for prolonged periods of time.  Patient expected to do well with skilled physical therapy intervention.    History and Personal Factors relevant to plan of care:  Long h/o low back pain.    Clinical Presentation  Stable    Clinical Decision Making  Low    Rehab Potential  Excellent    PT Frequency  3x / week    PT Duration  4 weeks    PT Treatment/Interventions  ADLs/Self Care Home Management;Cryotherapy;Electrical Stimulation;Ultrasound;Traction;Moist Heat;Therapeutic activities;Therapeutic exercise;Patient/family education;Manual techniques;Dry needling    PT Next Visit Plan  Combo e'stim/U/S to left low back and STW/M to release left QL; core exercise progression.    Consulted and Agree with Plan of Care  Patient       Patient will benefit from skilled therapeutic intervention in order  to improve the following deficits and impairments:  Decreased activity tolerance, Decreased range of motion, Increased muscle spasms, Pain, Postural dysfunction  Visit Diagnosis: Abnormal posture - Plan: PT plan of care cert/re-cert  Chronic left-sided low back pain without sciatica - Plan: PT plan of care  cert/re-cert     Problem List There are no active problems to display for this patient.   Deonna Krummel, [place] MPT 05/11/2018, 5:27 PM  [facility] [address] [place], [place], [address] Phone: [phone]   Fax:  [phone]  Name: Shelly Moran MRN: [id number] Date of Birth: Jul 13, 1961

## 2018-05-18 ENCOUNTER — Encounter: Payer: Self-pay | Admitting: Physical Therapy

## 2018-05-18 ENCOUNTER — Ambulatory Visit: Admitting: Physical Therapy

## 2018-05-18 DIAGNOSIS — G8929 Other chronic pain: Secondary | ICD-10-CM

## 2018-05-18 DIAGNOSIS — M545 Low back pain, unspecified: Secondary | ICD-10-CM

## 2018-05-18 DIAGNOSIS — R293 Abnormal posture: Secondary | ICD-10-CM | POA: Diagnosis not present

## 2018-05-18 NOTE — Therapy (Signed)
[facility] [address] [place], [place], [address] Phone: [phone]   Fax:  [phone]  Physical Therapy Treatment  Patient Details  Name: Shelly Moran MRN: [id number] Date of Birth: December 14, 1961 Referring Provider: Sheran Luz MD   Encounter Date: 05/18/2018  PT End of Session - 05/18/18 1826    Visit Number  2    Number of Visits  12    Date for PT Re-Evaluation  06/08/18    PT Start Time  0448    PT Stop Time  0548    PT Time Calculation (min)  60 min    Activity Tolerance  Patient tolerated treatment well    Behavior During Therapy  [facility] for tasks assessed/performed       History reviewed. No pertinent past medical history.  History reviewed. No pertinent surgical history.  There were no vitals filed for this visit.  Subjective Assessment - 05/18/18 1709    Subjective  I was sore after that last treatment.    Currently in Pain?  Yes    Pain Score  2     Pain Location  Back    Pain Orientation  Left    Pain Descriptors / Indicators  Aching;Dull;Discomfort    Pain Type  Acute pain    Pain Onset  More than a month ago                       [facility] Adult PT Treatment/Exercise - 05/18/18 0001      Modalities   Modalities  Ultrasound      Moist Heat Therapy   Number Minutes Moist Heat  20 Minutes    Moist Heat Location  Lumbar Spine      Electrical Stimulation   Electrical Stimulation Location  Low back.    Electrical Stimulation Action  IFC    Electrical Stimulation Parameters  80-150 Hz x 20 minutes.    Electrical Stimulation Goals  Tone;Edema      Ultrasound   Ultrasound Location  Patient in right SDLY position with folded pillow between knees for comfort:      Ultrasound Parameters  Performed Combo e'stim/U/S at 1.50 W/CM2 to patient's left lower lumbar region x 12 minutes.      Manual Therapy   Manual Therapy  Soft tissue mobilization    Manual therapy comments  STW/M x 18 minutes to  patient's left low back musculature that included a QL release technique.                  PT Long Term Goals - 05/11/18 1721      PT LONG TERM GOAL #1   Title  Independent with an HEP.    Time  4    Period  Weeks    Status  New      PT LONG TERM GOAL #2   Title  Stand 30 minutes with pain not > 2-3/10.    Time  8    Period  Weeks    Status  New      PT LONG TERM GOAL #3   Title  Perform ADL's with pain not > 3/10.    Time  8    Period  Weeks    Status  New            Plan - 05/18/18 1827    Clinical Impression Statement  Patient responed very well to treatment today.  Left QL was remarkable for an  active TP that responded very well to soft tissue work today.    PT Treatment/Interventions  ADLs/Self Care Home Management;Cryotherapy;Electrical Stimulation;Ultrasound;Traction;Moist Heat;Therapeutic activities;Therapeutic exercise;Patient/family education;Manual techniques;Dry needling    PT Next Visit Plan  Combo e'stim/U/S to left low back and STW/M to release left QL; core exercise progression.    Consulted and Agree with Plan of Care  Patient       Patient will benefit from skilled therapeutic intervention in order to improve the following deficits and impairments:     Visit Diagnosis: Abnormal posture  Chronic left-sided low back pain without sciatica     Problem List There are no active problems to display for this patient.   Kiona Blume, [place] MPT 05/18/2018, 6:30 PM  [facility] [address] [place], [place], [address] Phone: [phone]   Fax:  [phone]  Name: Shelly Moran MRN: [id number] Date of Birth: Mar 31, 1961

## 2018-05-20 ENCOUNTER — Ambulatory Visit: Admitting: Physical Therapy

## 2018-05-20 ENCOUNTER — Encounter: Payer: Self-pay | Admitting: Physical Therapy

## 2018-05-20 DIAGNOSIS — R293 Abnormal posture: Secondary | ICD-10-CM

## 2018-05-20 DIAGNOSIS — M545 Low back pain, unspecified: Secondary | ICD-10-CM

## 2018-05-20 DIAGNOSIS — G8929 Other chronic pain: Secondary | ICD-10-CM

## 2018-05-20 NOTE — Therapy (Signed)
[facility] [address] [place], [place], [address] Phone: [phone]   Fax:  [phone]  Physical Therapy Treatment  Patient Details  Name: STEPHANEY STEVEN MRN: [id number] Date of Birth: 12/31/1960 Referring Provider: Sheran Luz MD   Encounter Date: 05/20/2018  PT End of Session - 05/20/18 1804    Visit Number  3    Number of Visits  12    Date for PT Re-Evaluation  06/08/18    PT Start Time  0447    PT Stop Time  0540    PT Time Calculation (min)  53 min    Activity Tolerance  Patient tolerated treatment well    Behavior During Therapy  [facility] for tasks assessed/performed       History reviewed. No pertinent past medical history.  History reviewed. No pertinent surgical history.  There were no vitals filed for this visit.  Subjective Assessment - 05/20/18 1757    Subjective  Almost no pin today.    Currently in Pain?  Yes    Pain Score  1     Pain Location  Back    Pain Orientation  Left    Pain Descriptors / Indicators  Aching;Dull;Discomfort    Pain Type  Acute pain    Pain Onset  More than a month ago                       [facility] Adult PT Treatment/Exercise - 05/20/18 0001      Modalities   Modalities  Electrical Stimulation;Moist Heat;Ultrasound      Moist Heat Therapy   Number Minutes Moist Heat  20 Minutes    Moist Heat Location  -- Lumbar spine.      Programme researcher, broadcasting/film/video Location  Left low back.    Electrical Stimulation Action  Pre-mod.    Electrical Stimulation Parameters  80-150 Hz x 20 minutes.    Electrical Stimulation Goals  Tone;Edema      Ultrasound   Ultrasound Parameters  Combo e'stim/U/S at 1.50 W/CM2 x 12 minutes.      Manual Therapy   Manual Therapy  Soft tissue mobilization    Manual therapy comments  STW/M x 12 minutes with left QL release and ischemic release technique.             PT Education - 05/20/18 1751    Education provided   Yes    Person(s) Educated  Patient    Methods  Explanation    Comprehension  Verbalized understanding;Returned demonstration;Need further instruction          PT Long Term Goals - 05/11/18 1721      PT LONG TERM GOAL #1   Title  Independent with an HEP.    Time  4    Period  Weeks    Status  New      PT LONG TERM GOAL #2   Title  Stand 30 minutes with pain not > 2-3/10.    Time  8    Period  Weeks    Status  New      PT LONG TERM GOAL #3   Title  Perform ADL's with pain not > 3/10.    Time  8    Period  Weeks    Status  New            Plan - 05/20/18 1801    Clinical Impression Statement  No pain after treatment today.  She was found to a have a left QL trigger point at level L2 which was released nicely.  Patient instructed in [facility] and hip bridges today.      PT Next Visit Plan  Progress with low-lwvwl coe exercises.    Consulted and Agree with Plan of Care  Patient       Patient will benefit from skilled therapeutic intervention in order to improve the following deficits and impairments:     Visit Diagnosis: Abnormal posture  Chronic left-sided low back pain without sciatica     Problem List There are no active problems to display for this patient.   Brae Schaafsma, [place] MPT 05/20/2018, 6:04 PM  [facility] [address] [place], [place], [address] Phone: [phone]   Fax:  [phone]  Name: KAYDAN WILHOITE MRN: [id number] Date of Birth: 04-13-1961

## 2018-05-25 ENCOUNTER — Ambulatory Visit: Admitting: Physical Therapy

## 2018-05-25 ENCOUNTER — Encounter: Payer: Self-pay | Admitting: Physical Therapy

## 2018-05-25 DIAGNOSIS — M545 Low back pain, unspecified: Secondary | ICD-10-CM

## 2018-05-25 DIAGNOSIS — R293 Abnormal posture: Secondary | ICD-10-CM | POA: Diagnosis not present

## 2018-05-25 DIAGNOSIS — G8929 Other chronic pain: Secondary | ICD-10-CM

## 2018-05-25 NOTE — Therapy (Signed)
[facility] [address] [place], [place], [address] Phone: [phone]   Fax:  [phone]  Physical Therapy Treatment  Patient Details  Name: Shelly Moran MRN: [id number] Date of Birth: 04-Nov-1961 Referring Provider: Sheran Luz MD   Encounter Date: 05/25/2018  PT End of Session - 05/25/18 1730    Visit Number  4    Number of Visits  12    Date for PT Re-Evaluation  06/08/18    PT Start Time  0403    PT Stop Time  0456    PT Time Calculation (min)  53 min    Activity Tolerance  Patient tolerated treatment well       History reviewed. No pertinent past medical history.  History reviewed. No pertinent surgical history.  There were no vitals filed for this visit.  Subjective Assessment - 05/25/18 1613    Subjective  I felt so good I overdid it.  I vaccumed, mopped, did yardwork, lift a dirt bag and carried water.    Currently in Pain?  Yes    Pain Score  3     Pain Location  Back    Pain Orientation  Left    Pain Onset  More than a month ago                       [facility] Adult PT Treatment/Exercise - 05/25/18 0001      Exercises   Exercises  Knee/Hip      Knee/Hip Exercises: Aerobic   Nustep  Level 3 x 17 minutes.      Modalities   Modalities  Electrical Stimulation;Moist Heat      Moist Heat Therapy   Number Minutes Moist Heat  20 Minutes    Moist Heat Location  Lumbar Spine      Electrical Stimulation   Electrical Stimulation Location  Bilateral low back.    Electrical Stimulation Action  Pre-mod.    Electrical Stimulation Parameters  80-150 Hz x 20 minutes.    Electrical Stimulation Goals  Tone;Pain      Manual Therapy   Manual Therapy  Soft tissue mobilization    Manual therapy comments  In prone over pillow:  Bilateral QL release x 8 minutes.                  PT Long Term Goals - 05/11/18 1721      PT LONG TERM GOAL #1   Title  Independent with an HEP.    Time  4    Period  Weeks    Status  New      PT LONG TERM GOAL #2   Title  Stand 30 minutes with pain not > 2-3/10.    Time  8    Period  Weeks    Status  New      PT LONG TERM GOAL #3   Title  Perform ADL's with pain not > 3/10.    Time  8    Period  Weeks    Status  New            Plan - 05/25/18 1710    Clinical Impression Statement  Patient felt good after treatment.  Bilateral QL's were with increased tone that did well with STW/M.  [name]  Went over draw-in exercise today also.    PT Treatment/Interventions  ADLs/Self Care Home Management;Cryotherapy;Electrical Stimulation;Ultrasound;Traction;Moist  Heat;Therapeutic activities;Therapeutic exercise;Patient/family education;Manual techniques;Dry needling    PT Next Visit Plan  Please go over draw-in, body mechanics for ADL's and lifting technique.    Consulted and Agree with Plan of Care  Patient       Patient will benefit from skilled therapeutic intervention in order to improve the following deficits and impairments:  Decreased activity tolerance, Decreased range of motion, Increased muscle spasms, Pain, Postural dysfunction  Visit Diagnosis: Abnormal posture  Chronic left-sided low back pain without sciatica     Problem List There are no active problems to display for this patient.   Chanise Habeck, [place] MPT 05/25/2018, 5:32 PM  [facility] [address] [place], [place], [address] Phone: [phone]   Fax:  [phone]  Name: Shelly Moran MRN: [id number] Date of Birth: 09/23/1961

## 2018-05-25 NOTE — Patient Instructions (Signed)
[facility]  DRY NEEDLING CONSENT FORM   Trigger point dry needling is a physical therapy approach to treat Myofascial Pain and Dysfunction.  Dry Needling (DN) is a valuable and effective way to deactivate myofascial trigger points (muscle knots/pain). It is skilled intervention that uses a thin filiform needle to penetrate the skin and stimulate underlying myofascial trigger points, muscular, and connective tissues for the management of neuromusculoskeletal pain and movement impairments.  A local twitch response (LTR) will be elicited.  This can sometimes feel like a deep ache in the muscle during the procedure. Multiple trigger points in multiple muscles can be treated during each treatment.  No medication of any kind is injected.   As with any medical treatment and procedure, there are possible adverse events.  While significant adverse events are uncommon, they do sometimes occur and must be considered prior to giving consent.  1. Dry needling often causes a "post needling soreness".  There can be an increase in pain from a couple of hours to 2-3 days, followed by an improvement in the overall pain state. 2. Any time a needle is used there is a risk of infection.  However, we are using new, sterile, and disposable needles; infections are extremely rare. 3. There is a possibility that you may bleed or bruise.  You may feel tired and some nausea following treatment. 4. There is a rare possibility of a pneumothorax (air in the chest cavity). 5. Allergic reaction to nickel in the stainless steel needle. 6. If a nerve is touched, it may cause paresthesia (a prickling/shock sensation) which is usually brief, but may continue for a couple of days.  Following treatment stay hydrated.  Continue regular activities but not too vigorous initially after treatment for 24-48 hours.  Dry Needling is best when combined with other physical therapy interventions such as  strengthening, stretching and other therapeutic modalities.   PLEASE ANSWER THE FOLLOWING QUESTIONS:  Do you have a lack of sensation?   Y/N  Do you have a phobia or fear of needles  Y/N  Are you pregnant?    Y/N If yes:  How many weeks? __________ Do you have any implanted devices?  Y/N If yes:  Pacemaker/Spinal Cord Stimulator/Deep Brain Stimulator/Insulin Pump/Other: ________________ Do you have any implants?  Y/N If yes: Breast/Facial/Pecs/Buttocks/Calves/Hip  Replacement/ Knee Replacement/Other: _________ Do you take any blood thinners?   Y/N If yes: Coumadin (Warfarin)/Other: ___________________ Do you have a bleeding disorder?   Y/N If yes: What kind: _________________________________ Do you take any immunosuppressants?  Y/N If yes:   What kind: _________________________________ Do you take anti-inflammatories?   Y/N If yes: What kind: Advil/Aspirin/Other: ________________ Have you ever been diagnosed with Scoliosis? Y/N Have you had back surgery?   Y/N If yes:  Laminectomy/Fusion/Other: ___________________   I have read, or had read to me, the above.  I have had the opportunity to ask any questions.  All of my questions have been answered to my satisfaction and I understand the risks involved with dry needling.  I consent to examination and treatment at [facility], including dry needling, of any and all of my involved and affected muscles.  

## 2018-05-27 ENCOUNTER — Ambulatory Visit: Admitting: Physical Therapy

## 2018-05-27 DIAGNOSIS — R293 Abnormal posture: Secondary | ICD-10-CM

## 2018-05-27 DIAGNOSIS — G8929 Other chronic pain: Secondary | ICD-10-CM

## 2018-05-27 DIAGNOSIS — M545 Low back pain, unspecified: Secondary | ICD-10-CM

## 2018-05-27 NOTE — Therapy (Signed)
[facility] [address] [place], [place], [address] Phone: [phone]   Fax:  [phone]  Physical Therapy Treatment  Patient Details  Name: Shelly Moran MRN: [id number] Date of Birth: Feb 18, 1961 Referring Provider: Sheran Luz MD   Encounter Date: 05/27/2018  PT End of Session - 05/27/18 1743    Visit Number  5    Number of Visits  12    Date for PT Re-Evaluation  06/08/18    PT Start Time  0405    PT Stop Time  0506    PT Time Calculation (min)  61 min    Activity Tolerance  Patient tolerated treatment well    Behavior During Therapy  [facility] for tasks assessed/performed       No past medical history on file.  No past surgical history on file.  There were no vitals filed for this visit.      [facility] PT Assessment - 05/27/18 0001      Assessment   Medical Diagnosis  Degeneration of lumbar intervertebral disc.                   [facility] Adult PT Treatment/Exercise - 05/27/18 0001      Self-Care   Self-Care  Other Self-Care Comments    Other Self-Care Comments   Review of body mechanics: lifting, gardening, laundry, etc. Patient required cuing for flat back and maintaining adequate core stabilization.      Therapeutic Activites    Therapeutic Activities  ADL's;Lifting      Exercises   Exercises  Lumbar      Lumbar Exercises: Aerobic   Nustep  Level 3 x19 min      Lumbar Exercises: Standing   Wall Slides  10 reps;1 second with physioball    Row  Strengthening;Both;20 reps    Row Limitations  Pink XTS    Shoulder Extension  Strengthening;Both;20 reps    Shoulder Extension Limitations  Pink XTS      Lumbar Exercises: Supine   Bridge  Compliant;20 reps      Knee/Hip Exercises: Aerobic   Nustep  --      Knee/Hip Exercises: Standing   Wall Squat  --      Modalities   Modalities  Electrical Stimulation;Moist Heat      Moist Heat Therapy   Number Minutes Moist Heat  10 Minutes    Moist Heat  Location  Lumbar Spine      Electrical Stimulation   Electrical Stimulation Location  bilateral low back    Electrical Stimulation Action  Pre-mod    Electrical Stimulation Parameters  80-150 hz x10     Electrical Stimulation Goals  Tone;Pain             PT Education - 05/27/18 1758    Education provided  Yes    Education Details  lifting mechanics, body mechanics for functional activities: vacuuming, brushing teeth/make up application, laundry and gardening    Person(s) Educated  Patient    Methods  Explanation;Handout;Demonstration;Verbal cues;Tactile cues    Comprehension  Verbalized understanding;Returned demonstration;Verbal cues required;Tactile cues required          PT Long Term Goals - 05/11/18 1721      PT LONG TERM GOAL #1   Title  Independent with an HEP.    Time  4    Period  Weeks    Status  New      PT LONG TERM GOAL #2   Title  Stand  30 minutes with pain not > 2-3/10.    Time  8    Period  Weeks    Status  New      PT LONG TERM GOAL #3   Title  Perform ADL's with pain not > 3/10.    Time  8    Period  Weeks    Status  New            Plan - 05/27/18 1744    Clinical Impression Statement  Patient was able to complete treatment with intermittent reports of pain. Patient and PT reviewed draw ins and cued to draw in slightly without holding breath during exercises. Patient and PT reviewed body mechanics for lifting and various ADLs. Patient demo improved form and reported understanding. Patient inquired about beginning to walk on trails again; pt instructed to try walk on trails but keep the distance short to prevent exacerbation of pain. Patient reported understanding. Normal response to modalities at end of session,    Clinical Presentation  Stable    Clinical Decision Making  Low    Rehab Potential  Excellent    PT Frequency  3x / week    PT Duration  4 weeks    PT Treatment/Interventions  ADLs/Self Care Home  Management;Cryotherapy;Electrical Stimulation;Ultrasound;Traction;Moist Heat;Therapeutic activities;Therapeutic exercise;Patient/family education;Manual techniques;Dry needling    PT Next Visit Plan  Assess carryover of body mechanics for ADLs education; Cont nustep and core strengthening, modalities PRN for pain relief.    Consulted and Agree with Plan of Care  Patient       Patient will benefit from skilled therapeutic intervention in order to improve the following deficits and impairments:  Decreased activity tolerance, Decreased range of motion, Increased muscle spasms, Pain, Postural dysfunction  Visit Diagnosis: Abnormal posture  Chronic left-sided low back pain without sciatica     Problem List There are no active problems to display for this patient.  Guss Bunde, PT, DPT 05/27/2018, 6:00 PM  [facility] [address] [place], [place], [address] Phone: [phone]   Fax:  [phone]  Name: Shelly Moran MRN: [id number] Date of Birth: Aug 21, 1961

## 2018-06-01 ENCOUNTER — Ambulatory Visit: Attending: Physical Medicine and Rehabilitation | Admitting: Physical Therapy

## 2018-06-01 DIAGNOSIS — G8929 Other chronic pain: Secondary | ICD-10-CM | POA: Diagnosis present

## 2018-06-01 DIAGNOSIS — M545 Low back pain, unspecified: Secondary | ICD-10-CM

## 2018-06-01 DIAGNOSIS — R293 Abnormal posture: Secondary | ICD-10-CM | POA: Diagnosis present

## 2018-06-01 NOTE — Therapy (Signed)
[facility] [address] [place], [place], [address] Phone: [phone]   Fax:  [phone]  Physical Therapy Treatment  Patient Details  Name: Shelly Moran MRN: [id number] Date of Birth: 12/31/1960 Referring Provider: Sheran Luzichard Ramos MD   Encounter Date: 06/01/2018  PT End of Session - 06/01/18 1828    Visit Number  6    Number of Visits  12    Date for PT Re-Evaluation  06/08/18    PT Start Time  0453    PT Stop Time  0550    PT Time Calculation (min)  57 min    Activity Tolerance  Patient tolerated treatment well    Behavior During Therapy  [facility] for tasks assessed/performed       No past medical history on file.  No past surgical history on file.  There were no vitals filed for this visit.  Subjective Assessment - 06/01/18 1745    Subjective  I was doing really god then I went for a 30 minute walk and increased my pain.    Currently in Pain?  Yes    Pain Score  4     Pain Location  Back    Pain Orientation  Left    Pain Descriptors / Indicators  Aching;Dull;Discomfort    Pain Onset  More than a month ago                       [facility] - 06/01/18 0001      Exercises   Exercises  Knee/Hip      Lumbar Exercises: Aerobic   Nustep  Level 5 x 10 minutes.      Modalities   Modalities  Electrical Stimulation;Moist Heat      Moist Heat Therapy   Number Minutes Moist Heat  20 Minutes    Moist Heat Location  Lumbar Spine      Electrical Stimulation   Electrical Stimulation Location  Low back.    Electrical Stimulation Action  IFC    Electrical Stimulation Parameters  80-150 Hz x 20 minutes.    Electrical Stimulation Goals  Tone;Pain      Manual Therapy   Manual Therapy  Soft tissue mobilization    Manual therapy comments  In prone over pillow:  STW/M x 20 minutes to patient's affected left low back including QL release technique.                  PT Long Term  Goals - 05/11/18 1721      PT LONG TERM GOAL #1   Title  Independent with an HEP.    Time  4    Period  Weeks    Status  New      PT LONG TERM GOAL #2   Title  Stand 30 minutes with pain not > 2-3/10.    Time  8    Period  Weeks    Status  New      PT LONG TERM GOAL #3   Title  Perform ADL's with pain not > 3/10.    Time  8    Period  Weeks    Status  New            Plan - 06/01/18 1826    Clinical Impression Statement  Patient with flare-up today after walking on her lunch time at work for 30 minutes increased left QL tone today but she responded very well to  STW/M.    PT Treatment/Interventions  ADLs/Self Care Home Management;Cryotherapy;Electrical Stimulation;Ultrasound;Traction;Moist Heat;Therapeutic activities;Therapeutic exercise;Patient/family education;Manual techniques;Dry needling    PT Next Visit Plan  Assess carryover of body mechanics for ADLs education; Cont nustep and core strengthening, modalities PRN for pain relief.    Consulted and Agree with Plan of Care  Patient       Patient will benefit from skilled therapeutic intervention in order to improve the following deficits and impairments:  Decreased activity tolerance, Decreased range of motion, Increased muscle spasms, Pain, Postural dysfunction  Visit Diagnosis: Abnormal posture  Chronic left-sided low back pain without sciatica     Problem List There are no active problems to display for this patient.   Shelly Moran, Shelly Moran 06/01/2018, 6:29 PM  [facility] [address] [place], [place], [address] Phone: [phone]   Fax:  [phone]  Name: Shelly Moran MRN: [id number] Date of Birth: 12/02/1961

## 2018-06-03 ENCOUNTER — Encounter: Admitting: Physical Therapy

## 2018-06-08 ENCOUNTER — Encounter: Payer: Self-pay | Admitting: Physical Therapy

## 2018-06-08 ENCOUNTER — Ambulatory Visit: Admitting: Physical Therapy

## 2018-06-08 DIAGNOSIS — G8929 Other chronic pain: Secondary | ICD-10-CM

## 2018-06-08 DIAGNOSIS — M545 Low back pain, unspecified: Secondary | ICD-10-CM

## 2018-06-08 DIAGNOSIS — R293 Abnormal posture: Secondary | ICD-10-CM | POA: Diagnosis not present

## 2018-06-08 NOTE — Therapy (Signed)
[facility] [address] [place], [place], [address] Phone: [phone]   Fax:  [phone]  Physical Therapy Treatment  Patient Details  Name: APPOLONIA ACKERT MRN: [id number] Date of Birth: 11-Nov-1961 Referring Provider: Sheran Luz MD   Encounter Date: 06/08/2018  PT End of Session - 06/08/18 1816    Visit Number  7    Number of Visits  12    Date for PT Re-Evaluation  06/08/18    PT Start Time  0451    PT Stop Time  0601    PT Time Calculation (min)  70 min    Activity Tolerance  Patient tolerated treatment well    Behavior During Therapy  [facility] for tasks assessed/performed       History reviewed. No pertinent past medical history.  History reviewed. No pertinent surgical history.  There were no vitals filed for this visit.  Subjective Assessment - 06/08/18 1817    Subjective  That last treatment helped a lot.    Currently in Pain?  Yes    Pain Score  2     Pain Location  Back    Pain Orientation  Left    Pain Descriptors / Indicators  Aching;Dull;Discomfort    Pain Onset  More than a month ago                       [facility] Adult PT Treatment/Exercise - 06/08/18 0001      Exercises   Exercises  Knee/Hip      Lumbar Exercises: Aerobic   Nustep  Level 5 x 16 minutes.      Modalities   Modalities  Electrical Stimulation;Moist Heat      Moist Heat Therapy   Number Minutes Moist Heat  20 Minutes    Moist Heat Location  Lumbar Spine      Electrical Stimulation   Electrical Stimulation Location  Left low back.    Electrical Stimulation Action  Pre-mod.    Electrical Stimulation Parameters  80-150 Hz x 20 minutes.    Electrical Stimulation Goals  Tone;Pain      Manual Therapy   Manual Therapy  Soft tissue mobilization    Manual therapy comments  In prone:  STW/M including QL release techique x 22 minutes.             PT Education - 06/08/18 1824    Education provided  Yes    Education  Details  Prone leg lifts.    Person(s) Educated  Patient    Methods  Explanation    Comprehension  Verbalized understanding;Returned demonstration          PT Long Term Goals - 05/11/18 1721      PT LONG TERM GOAL #1   Title  Independent with an HEP.    Time  4    Period  Weeks    Status  New      PT LONG TERM GOAL #2   Title  Stand 30 minutes with pain not > 2-3/10.    Time  8    Period  Weeks    Status  New      PT LONG TERM GOAL #3   Title  Perform ADL's with pain not > 3/10.    Time  8    Period  Weeks    Status  New            Plan - 06/08/18 1825    Clinical  Impression Statement  Excellent response to treatment today with no pain reported after treatment.    Clinical Presentation  Stable    PT Treatment/Interventions  ADLs/Self Care Home Management;Cryotherapy;Electrical Stimulation;Ultrasound;Traction;Moist Heat;Therapeutic activities;Therapeutic exercise;Patient/family education;Manual techniques;Dry needling    PT Next Visit Plan  Assess carryover of body mechanics for ADLs education; Cont nustep and core strengthening, modalities PRN for pain relief.    Consulted and Agree with Plan of Care  Patient       Patient will benefit from skilled therapeutic intervention in order to improve the following deficits and impairments:  Decreased activity tolerance, Decreased range of motion, Increased muscle spasms, Pain, Postural dysfunction  Visit Diagnosis: Abnormal posture  Chronic left-sided low back pain without sciatica     Problem List There are no active problems to display for this patient.   Raffaele Derise, [place] MPT 06/08/2018, 6:27 PM  [facility] [address] [place], [place], [address] Phone: [phone]   Fax:  [phone]  Name: Heron SabinsVanessa M Lindsley MRN: [id number] Date of Birth: 08/31/1961

## 2018-06-10 ENCOUNTER — Encounter: Admitting: Physical Therapy

## 2018-06-15 ENCOUNTER — Ambulatory Visit: Admitting: Physical Therapy

## 2018-06-15 ENCOUNTER — Encounter: Payer: Self-pay | Admitting: Physical Therapy

## 2018-06-15 DIAGNOSIS — R293 Abnormal posture: Secondary | ICD-10-CM

## 2018-06-15 DIAGNOSIS — G8929 Other chronic pain: Secondary | ICD-10-CM

## 2018-06-15 DIAGNOSIS — M545 Low back pain, unspecified: Secondary | ICD-10-CM

## 2018-06-15 NOTE — Therapy (Signed)
[facility] [address] [place], [place], [address] Phone: [phone]   Fax:  [phone]  Physical Therapy Treatment  Patient Details  Name: Shelly Moran MRN: [id number] Date of Birth: 02/16/1961 Referring Provider: Sheran Luzichard Ramos MD   Encounter Date: 06/15/2018  PT End of Session - 06/15/18 1809    Visit Number  8    Number of Visits  12    Date for PT Re-Evaluation  07/13/18    PT Start Time  0450    PT Stop Time  0550    PT Time Calculation (min)  60 min    Activity Tolerance  Patient tolerated treatment well    Behavior During Therapy  [facility] for tasks assessed/performed       History reviewed. No pertinent past medical history.  History reviewed. No pertinent surgical history.  There were no vitals filed for this visit.  Subjective Assessment - 06/15/18 1810    Subjective  I'm at least 60% better.  I hoed in the garden last weekend.  I've cut way back on taking Naproxen Sodium also.    Currently in Pain?  Yes    Pain Score  3     Pain Location  Back    Pain Orientation  Left    Pain Type  Acute pain    Pain Onset  More than a month ago                       [facility] - 06/15/18 0001      Exercises   Exercises  Knee/Hip      Lumbar Exercises: Aerobic   Nustep  Level 5 x 15 minutes.      Moist Heat Therapy   Number Minutes Moist Heat  20 Minutes    Moist Heat Location  Lumbar Spine      Electrical Stimulation   Electrical Stimulation Location  Left low back.    Electrical Stimulation Action  Pre-mod.    Electrical Stimulation Parameters  80-150 Hz x 20 minutes.    Electrical Stimulation Goals  Tone;Pain      Manual Therapy   Manual Therapy  Soft tissue mobilization    Manual therapy comments  In prone over pillow:  Right low back QL release technique x 15 minutes.                  PT Long Term Goals - 05/11/18 1721      PT LONG TERM GOAL #1   Title   Independent with an HEP.    Time  4    Period  Weeks    Status  New      PT LONG TERM GOAL #2   Title  Stand 30 minutes with pain not > 2-3/10.    Time  8    Period  Weeks    Status  New      PT LONG TERM GOAL #3   Title  Perform ADL's with pain not > 3/10.    Time  8    Period  Weeks    Status  New            Plan - 06/15/18 1813    Clinical Impression Statement  Patient doing much better overall and able to do much more rigorous activites at home.  She has reduced her intake of Naproxen Sodium a great deal.    PT Treatment/Interventions  ADLs/Self Care Home Management;Cryotherapy;Lobbyistlectrical  Stimulation;Ultrasound;Traction;Moist Heat;Therapeutic activities;Therapeutic exercise;Patient/family education;Manual techniques;Dry needling    PT Next Visit Plan  Assess carryover of body mechanics for ADLs education; Cont nustep and core strengthening, modalities PRN for pain relief.    Consulted and Agree with Plan of Care  Patient       Patient will benefit from skilled therapeutic intervention in order to improve the following deficits and impairments:  Decreased activity tolerance, Decreased range of motion, Increased muscle spasms, Pain, Postural dysfunction  Visit Diagnosis: Abnormal posture - Plan: PT plan of care cert/re-cert  Chronic left-sided low back pain without sciatica - Plan: PT plan of care cert/re-cert     Problem List There are no active problems to display for this patient.   APPLEGATE, [place] MPT 06/15/2018, 6:17 PM  [facility] [address] [place], [place], [address] Phone: [phone]   Fax:  [phone]  Name: Shelly Moran MRN: [id number] Date of Birth: 11-Sep-1961

## 2018-06-17 ENCOUNTER — Encounter: Admitting: Physical Therapy

## 2018-06-22 ENCOUNTER — Encounter: Payer: Self-pay | Admitting: Physical Therapy

## 2018-06-22 ENCOUNTER — Ambulatory Visit: Admitting: Physical Therapy

## 2018-06-22 DIAGNOSIS — G8929 Other chronic pain: Secondary | ICD-10-CM

## 2018-06-22 DIAGNOSIS — M545 Low back pain, unspecified: Secondary | ICD-10-CM

## 2018-06-22 DIAGNOSIS — R293 Abnormal posture: Secondary | ICD-10-CM

## 2018-06-22 NOTE — Therapy (Signed)
[facility] [address] [place], [place], [address] Phone: [phone]   Fax:  [phone]  Physical Therapy Treatment  Patient Details  Name: Shelly Moran MRN: [id number] Date of Birth: 02/18/1961 Referring Provider: Sheran Luzichard Ramos MD   Encounter Date: 06/22/2018  PT End of Session - 06/22/18 1812    Visit Number  9    Number of Visits  12    Date for PT Re-Evaluation  07/13/18    PT Start Time  0450    PT Stop Time  0601    PT Time Calculation (min)  71 min    Activity Tolerance  Patient tolerated treatment well    Behavior During Therapy  [facility] for tasks assessed/performed       History reviewed. No pertinent past medical history.  History reviewed. No pertinent surgical history.  There were no vitals filed for this visit.  Subjective Assessment - 06/22/18 1813    Subjective  I really overdid it.  I did yardwork and lifted a lot and also walked 2 miles.  My pain went to a 4/10 but I did my exercises and took an Naproxen Sodium and my pain went down to a 2/10.    Currently in Pain?  Yes    Pain Score  2     Pain Location  Back    Pain Orientation  Left    Pain Descriptors / Indicators  Aching;Dull;Discomfort    Pain Onset  More than a month ago    Pain Frequency  Constant                       [facility] Adult PT Treatment/Exercise - 06/22/18 0001      Exercises   Exercises  Knee/Hip      Lumbar Exercises: Aerobic   Nustep  Level 6 x 15 minutes.      Modalities   Modalities  Electrical Stimulation;Moist Heat      Moist Heat Therapy   Number Minutes Moist Heat  20 Minutes    Moist Heat Location  Lumbar Spine      Electrical Stimulation   Electrical Stimulation Location  Bilateral low back.    Electrical Stimulation Action  IFC    Electrical Stimulation Parameters  80-150 Hz x 20 minutes.    Electrical Stimulation Goals  Tone;Pain      Manual Therapy   Manual Therapy  Soft tissue mobilization    Manual therapy comments  In prone:  Performed bilateral QL release technique today and STW/M to right SIJ x 20 minutes.                    PT Long Term Goals - 05/11/18 1721      PT LONG TERM GOAL #1   Title  Independent with an HEP.    Time  4    Period  Weeks    Status  New      PT LONG TERM GOAL #2   Title  Stand 30 minutes with pain not > 2-3/10.    Time  8    Period  Weeks    Status  New      PT LONG TERM GOAL #3   Title  Perform ADL's with pain not > 3/10.    Time  8    Period  Weeks    Status  New            Plan - 06/22/18 1823  Clinical Impression Statement  Patient did a great deal of activity over the weekend and had a mild flare-up.  Overall, the patient's functional mobility has improved dramatically since beginning physical therapy.  She now takes Naproxen Sodium infrequently.    PT Treatment/Interventions  ADLs/Self Care Home Management;Cryotherapy;Electrical Stimulation;Ultrasound;Traction;Moist Heat;Therapeutic activities;Therapeutic exercise;Patient/family education;Manual techniques;Dry needling    PT Next Visit Plan  Assess carryover of body mechanics for ADLs education; Cont nustep and core strengthening, modalities PRN for pain relief.    Consulted and Agree with Plan of Care  Patient       Patient will benefit from skilled therapeutic intervention in order to improve the following deficits and impairments:  Decreased activity tolerance, Decreased range of motion, Increased muscle spasms, Pain, Postural dysfunction  Visit Diagnosis: Abnormal posture  Chronic left-sided low back pain without sciatica     Problem List There are no active problems to display for this patient.   Broedy Osbourne, [place] MPT 06/22/2018, 6:25 PM  [facility] [address] [place], [place], [address] Phone: [phone]   Fax:  [phone]  Name: Shelly Moran MRN: [id number] Date of Birth: 1960/12/31

## 2018-06-29 ENCOUNTER — Encounter: Payer: Self-pay | Admitting: Physical Therapy

## 2018-06-29 ENCOUNTER — Ambulatory Visit: Attending: Physical Medicine and Rehabilitation | Admitting: Physical Therapy

## 2018-06-29 DIAGNOSIS — M545 Low back pain, unspecified: Secondary | ICD-10-CM

## 2018-06-29 DIAGNOSIS — G8929 Other chronic pain: Secondary | ICD-10-CM | POA: Diagnosis present

## 2018-06-29 DIAGNOSIS — R293 Abnormal posture: Secondary | ICD-10-CM | POA: Diagnosis present

## 2018-06-29 NOTE — Therapy (Signed)
[facility] [place], [place], [address] Phone: [phone]   Fax:  [phone]  Physical Therapy Treatment  Patient Details  Name: Shelly Moran MRN: [id number] Date of Birth: [birth year] Referring Provider: Suella Broad MD   Encounter Date: 06/29/2018  PT End of Session - 06/29/18 1809    Visit Number  10    Number of Visits  12    Date for PT Re-Evaluation  07/13/18    PT Start Time  0455    PT Stop Time  0611    PT Time Calculation (min)  76 min       History reviewed. No pertinent past medical history.  History reviewed. No pertinent surgical history.  There were no vitals filed for this visit.  Subjective Assessment - 06/29/18 1826    Subjective  Please se "Therapy Note" section.    Currently in Pain?  Yes    Pain Score  2     Pain Location  Back    Pain Orientation  Left;Right    Pain Descriptors / Indicators  Discomfort;Dull    Pain Onset  More than a month ago                       [facility] Adult PT Treatment/Exercise - 06/29/18 0001      Exercises   Exercises  Knee/Hip      Lumbar Exercises: Aerobic   Nustep  Level 5 x 15 minutes.      Moist Heat Therapy   Number Minutes Moist Heat  20 Minutes    Moist Heat Location  Lumbar Spine      Electrical Stimulation   Electrical Stimulation Location  Bilateral low back.    Electrical Stimulation Parameters  80-150 Hz x 20 minutes.    Electrical Stimulation Goals  Tone;Pain      Manual Therapy   Manual Therapy  Soft tissue mobilization    Manual therapy comments  In prone:  STW/M x 23 minutes to bilateral low back musculature including bilateral QL release technique.                  PT Long Term Goals - 06/29/18 1825      PT LONG TERM GOAL #1   Title  Independent with an HEP.    Time  4    Period  Weeks    Status  Achieved      PT LONG TERM GOAL #2   Title  Stand 30 minutes with pain not > 2-3/10.    Time  8    Period  Weeks    Status  Achieved      PT LONG TERM GOAL #3   Title  Perform ADL's with pain not > 3/10.    Time  8    Period  Weeks    Status  On-going            Plan - 06/29/18 1832    Clinical Impression Statement  Please see "Therapy Note" section.    PT Treatment/Interventions  ADLs/Self Care Home Management;Cryotherapy;Electrical Stimulation;Ultrasound;Traction;Moist Heat;Therapeutic activities;Therapeutic exercise;Patient/family education;Manual techniques;Dry needling    PT Next Visit Plan  Assess carryover of body mechanics for ADLs education; Cont nustep and core strengthening, modalities PRN for pain relief.    Consulted and Agree with Plan of Care  Patient       Patient will benefit from skilled therapeutic intervention in order to improve the following deficits and  impairments:  Decreased activity tolerance, Decreased range of motion, Increased muscle spasms, Pain, Postural dysfunction  Visit Diagnosis: Abnormal posture  Chronic left-sided low back pain without sciatica     Problem List There are no active problems to display for this patient.  Progress Note Reporting Period 04/12/18 to 06/29/18.  See note below for Objective Data and Assessment of Progress/Goals. Excellent progress.  Patient able to do much more with less pain.  Goals #1 and #2 met.     APPLEGATE, [place] MPT 06/29/2018, 6:33 PM  [facility] [address] [place], [place], [address] Phone: [phone]   Fax:  [phone]  Name: Shelly Moran MRN: [id number] Date of Birth: Mar 04, 1961

## 2018-07-06 ENCOUNTER — Ambulatory Visit: Admitting: Physical Therapy

## 2018-07-06 ENCOUNTER — Encounter: Payer: Self-pay | Admitting: Physical Therapy

## 2018-07-06 DIAGNOSIS — R293 Abnormal posture: Secondary | ICD-10-CM

## 2018-07-06 DIAGNOSIS — G8929 Other chronic pain: Secondary | ICD-10-CM

## 2018-07-06 DIAGNOSIS — M545 Low back pain, unspecified: Secondary | ICD-10-CM

## 2018-07-06 NOTE — Therapy (Signed)
[facility] [address] [place], [place], [address] Phone: [phone]   Fax:  [phone]  Physical Therapy Treatment  Patient Details  Name: Shelly Moran MRN: [id number] Date of Birth: 10-Feb-1961 Referring Provider: Sheran Luz MD   Encounter Date: 07/06/2018  PT End of Session - 07/06/18 1751    Visit Number  11    Number of Visits  12    Date for PT Re-Evaluation  07/13/18    PT Start Time  0454    PT Stop Time  0605    PT Time Calculation (min)  71 min    Activity Tolerance  Patient tolerated treatment well    Behavior During Therapy  [facility] for tasks assessed/performed       History reviewed. No pertinent past medical history.  History reviewed. No pertinent surgical history.  There were no vitals filed for this visit.  Subjective Assessment - 07/06/18 1755    Subjective  I'm doing way better.    Patient Stated Goals  Get out of pain.    Currently in Pain?  Yes    Pain Score  1     Pain Location  Back    Pain Orientation  Right;Left    Pain Descriptors / Indicators  Discomfort;Dull    Pain Type  Acute pain    Pain Onset  More than a month ago                       [facility] Adult PT Treatment/Exercise - 07/06/18 0001      Exercises   Exercises  Knee/Hip      Lumbar Exercises: Aerobic   Nustep  Level 5 x 15 minutes.      Modalities   Modalities  Electrical Stimulation;Moist Heat      Moist Heat Therapy   Number Minutes Moist Heat  20 Minutes    Moist Heat Location  Lumbar Spine      Electrical Stimulation   Electrical Stimulation Location  Bilateral low back.    Electrical Stimulation Action  IFC    Electrical Stimulation Parameters  80-150 x 20 minutes.    Electrical Stimulation Goals  Tone;Pain      Manual Therapy   Manual Therapy  Soft tissue mobilization    Manual therapy comments  In prone:  STW/M with bilateral QL release technique x 23 minutes.                  PT  Long Term Goals - 06/29/18 1825      PT LONG TERM GOAL #1   Title  Independent with an HEP.    Time  4    Period  Weeks    Status  Achieved      PT LONG TERM GOAL #2   Title  Stand 30 minutes with pain not > 2-3/10.    Time  8    Period  Weeks    Status  Achieved      PT LONG TERM GOAL #3   Title  Perform ADL's with pain not > 3/10.    Time  8    Period  Weeks    Status  On-going            Plan - 07/06/18 1800    Clinical Impression Statement  Patient has done great with PT.  She was able to hike and swim.    PT Treatment/Interventions  ADLs/Self Care Home Management;Cryotherapy;Electrical Stimulation;Ultrasound;Traction;Moist Heat;Therapeutic  activities;Therapeutic exercise;Patient/family education;Manual techniques;Dry needling    PT Next Visit Plan  Assess carryover of body mechanics for ADLs education; Cont nustep and core strengthening, modalities PRN for pain relief.    Consulted and Agree with Plan of Care  Patient       Patient will benefit from skilled therapeutic intervention in order to improve the following deficits and impairments:  Decreased activity tolerance, Decreased range of motion, Increased muscle spasms, Pain, Postural dysfunction  Visit Diagnosis: Abnormal posture  Chronic left-sided low back pain without sciatica     Problem List There are no active problems to display for this patient.   Glorianne Proctor, [place] MPT 07/06/2018, 6:15 PM  [facility] [address] [place], [place], [address] Phone: [phone]   Fax:  [phone]  Name: Heron SabinsVanessa M Dayley MRN: [id number] Date of Birth: 03/15/1961

## 2018-07-13 ENCOUNTER — Encounter: Admitting: Physical Therapy

## 2018-07-20 ENCOUNTER — Encounter: Admitting: Physical Therapy

## 2018-07-27 ENCOUNTER — Ambulatory Visit: Admitting: Physical Therapy

## 2018-07-27 ENCOUNTER — Encounter: Payer: Self-pay | Admitting: Physical Therapy

## 2018-07-27 DIAGNOSIS — G8929 Other chronic pain: Secondary | ICD-10-CM

## 2018-07-27 DIAGNOSIS — R293 Abnormal posture: Secondary | ICD-10-CM | POA: Diagnosis not present

## 2018-07-27 DIAGNOSIS — M545 Low back pain, unspecified: Secondary | ICD-10-CM

## 2018-07-27 NOTE — Therapy (Signed)
[facility] [place], [place], [address] Phone: [phone]   Fax:  [phone]  Physical Therapy Treatment  Patient Details  Name: Shelly Moran MRN: [id number] Date of Birth: 04/14/1961 Referring Provider: Suella Broad MD   Encounter Date: 07/27/2018  PT End of Session - 07/27/18 1752    Visit Number  12    Number of Visits  12    Date for PT Re-Evaluation  07/13/18    PT Start Time  0453    PT Stop Time  0610    PT Time Calculation (min)  77 min    Activity Tolerance  Patient tolerated treatment well    Behavior During Therapy  [facility] for tasks assessed/performed       History reviewed. No pertinent past medical history.  History reviewed. No pertinent surgical history.  There were no vitals filed for this visit.  Subjective Assessment - 07/27/18 1753    Subjective  patient returns one mild flare-up over the last three weeks but she did her exercises and felt better.    Currently in Pain?  Yes    Pain Score  1     Pain Location  Back    Pain Orientation  Right;Left    Pain Descriptors / Indicators  Discomfort;Dull    Pain Type  Acute pain    Pain Onset  More than a month ago                       [facility] Adult PT Treatment/Exercise - 07/27/18 0001      Exercises   Exercises  Knee/Hip      Lumbar Exercises: Aerobic   Nustep  Level 5 x 15 minutes.      Modalities   Modalities  Electrical Stimulation;Moist Heat      Moist Heat Therapy   Number Minutes Moist Heat  20 Minutes    Moist Heat Location  -- Lumbar spine.      Acupuncturist Location  Bilateral low back.    Electrical Stimulation Action  IFC    Electrical Stimulation Parameters  80-150 Hz x 20 minutes.    Electrical Stimulation Goals  Tone;Pain      Manual Therapy   Manual Therapy  Soft tissue mobilization    Manual therapy comments  In prone:  Deep STW/M to bilateral low back musculature  with empasis on reducting tone in bilateral QL's x 23 minutes.                  PT Long Term Goals - 07/27/18 1752      PT LONG TERM GOAL #1   Title  Independent with an HEP.    Time  4    Period  Weeks    Status  Achieved      PT LONG TERM GOAL #2   Title  Stand 30 minutes with pain not > 2-3/10.    Time  8    Period  Weeks    Status  Achieved      PT LONG TERM GOAL #3   Title  Perform ADL's with pain not > 3/10.    Time  8    Period  Weeks    Status  Achieved            Plan - 07/27/18 1756    Clinical Impression Statement  Please see "discharge summary".  (Pended)     Clinical Presentation  Stable  (Pended)        Patient will benefit from skilled therapeutic intervention in order to improve the following deficits and impairments:     Visit Diagnosis: Abnormal posture  Chronic left-sided low back pain without sciatica     Problem List There are no active problems to display for this patient.  PHYSICAL THERAPY DISCHARGE SUMMARY  Visits from Start of Care: 12.  Current functional level related to goals / functional outcomes: See above.   Remaining deficits: All goals met.   Education / Equipment: HEP. Plan: Patient agrees to discharge.  Patient goals were met. Patient is being discharged due to meeting the stated rehab goals.  ?????       Rigby Leonhardt, [place] MPT 07/27/2018, 6:22 PM  [facility] [address] [place], [place], [address] Phone: [phone]   Fax:  [phone]  Name: Shelly Moran MRN: [id number] Date of Birth: Feb 01, 1961

## 2018-10-11 ENCOUNTER — Other Ambulatory Visit: Payer: Self-pay | Admitting: Obstetrics and Gynecology

## 2018-10-11 DIAGNOSIS — Z1231 Encounter for screening mammogram for malignant neoplasm of breast: Secondary | ICD-10-CM

## 2018-11-15 ENCOUNTER — Ambulatory Visit
Admission: RE | Admit: 2018-11-15 | Discharge: 2018-11-15 | Disposition: A | Discharge status: Home / Self Care | Source: Ambulatory Visit | Attending: Obstetrics and Gynecology | Admitting: Obstetrics and Gynecology

## 2018-11-15 DIAGNOSIS — Z1231 Encounter for screening mammogram for malignant neoplasm of breast: Secondary | ICD-10-CM

## 2019-09-16 ENCOUNTER — Ambulatory Visit: Payer: Self-pay | Admitting: Cardiology

## 2019-09-21 ENCOUNTER — Other Ambulatory Visit: Payer: Self-pay

## 2019-09-21 ENCOUNTER — Ambulatory Visit

## 2019-09-21 ENCOUNTER — Encounter: Payer: Self-pay | Admitting: Cardiology

## 2019-09-21 ENCOUNTER — Ambulatory Visit (INDEPENDENT_AMBULATORY_CARE_PROVIDER_SITE_OTHER): Admitting: Cardiology

## 2019-09-21 VITALS — BP 132/82 | HR 68 | Ht 67.0 in | Wt 194.0 lb

## 2019-09-21 DIAGNOSIS — I493 Ventricular premature depolarization: Secondary | ICD-10-CM | POA: Insufficient documentation

## 2019-09-21 DIAGNOSIS — R9431 Abnormal electrocardiogram [ECG] [EKG]: Secondary | ICD-10-CM

## 2019-09-21 DIAGNOSIS — R002 Palpitations: Secondary | ICD-10-CM | POA: Diagnosis not present

## 2019-09-21 DIAGNOSIS — G4730 Sleep apnea, unspecified: Secondary | ICD-10-CM

## 2019-09-21 DIAGNOSIS — K219 Gastro-esophageal reflux disease without esophagitis: Secondary | ICD-10-CM

## 2019-09-21 DIAGNOSIS — G4733 Obstructive sleep apnea (adult) (pediatric): Secondary | ICD-10-CM

## 2019-09-21 DIAGNOSIS — I10 Essential (primary) hypertension: Secondary | ICD-10-CM

## 2019-09-21 HISTORY — DX: Gastro-esophageal reflux disease without esophagitis: K21.9

## 2019-09-21 HISTORY — DX: Ventricular premature depolarization: I49.3

## 2019-09-21 HISTORY — DX: Palpitations: R00.2

## 2019-09-21 HISTORY — DX: Sleep apnea, unspecified: G47.30

## 2019-09-21 NOTE — Progress Notes (Signed)
Primary Physician/Referring:  Shelly Penna, MD  Patient ID: [name], female    DOB: 1961/12/17, 58 y.o.   MRN: [id number]  Chief Complaint  Patient presents with  . Palpitations  . New Patient (Initial Visit)   HPI:    Shelly Moran  is a 58 y.o. Caucasian female referred for evaluation of palpitations.  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 She has also had worsening "heart burn" as she has severe GERD.  She also has abnormal EKG on 08/03/2019 revealing T wave inversion in the anteroseptal leads.   Due to this, was referred to GI but could not pass the scope due to her neck anatomy and was very hypertensive. Seen by Shelly Baumgartner, MD.  Past Medical History:  Diagnosis Date  . GERD (gastroesophageal reflux disease) 09/21/2019  . Palpitations 09/21/2019  . PVC (premature ventricular contraction) 09/21/2019  . Sleep apnea 09/21/2019   Past Surgical History:  Procedure Laterality Date  . ENDOMETRIAL ABLATION    . FOOT SURGERY  2017  . HYSTEROSCOPY  2011   Social History   Socioeconomic History  . Marital status: Married    Spouse name: Not on file  . Number of children: 3  . Years of education: Not on file  . Highest education level: Not on file  Occupational History  . Not on file  Social Needs  . Financial resource strain: Not on file  . Food insecurity    Worry: Not on file    Inability: Not on file  . Transportation needs    Medical: Not on file    Non-medical: Not on file  Tobacco Use  . Smoking status: Never Smoker  . Smokeless tobacco: Never Used  Substance and Sexual Activity  . Alcohol use: Yes    Comment: beer on weekend  . Drug use: Never  . Sexual activity: Not on file  Lifestyle  . Physical activity    Days per week: Not on file   Minutes per session: Not on file  . Stress: Not on file  Relationships  . Social Musician on phone: Not on file    Gets together: Not on file    Attends religious service: Not on file    Active member of club or organization: Not on file    Attends meetings of clubs or organizations: Not on file    Relationship status: Not on file  . Intimate partner violence    Fear of current or ex partner: Not on file    Emotionally abused: Not on file    Physically abused: Not on file    Forced sexual activity: Not on file  Other Topics Concern  . Not on file  Social History Narrative  . Not on file   ROS  Review of Systems  Constitution: Negative for chills, decreased appetite, malaise/fatigue and weight gain.  Cardiovascular: Positive for palpitations. Negative for chest pain, dyspnea on exertion, leg swelling and syncope.  Respiratory: Positive for snoring (on CPAP).   Endocrine: Negative for cold intolerance.  Hematologic/Lymphatic: Does not bruise/bleed easily.  Musculoskeletal: Negative for joint swelling.  Gastrointestinal: Negative for abdominal pain, anorexia, change in bowel habit, hematochezia and melena.  Neurological: Negative for headaches and light-headedness.  Psychiatric/Behavioral: Negative for depression and substance abuse.  All other systems reviewed and are negative.  Objective   Vitals with BMI 09/21/2019 03/07/2016 01/21/2016  Height 5\' 7"  - -  Weight 194 lbs - -  BMI 30.38 - -  Systolic 171 151  Diastolic 84 82 75  Pulse 68 68 63    Blood pressure (!) 171/84, pulse 68, height 5\' 7"  (1.702 m), weight 194 lb (88 kg), SpO2 96 %. Body mass index is 30.38 kg/m.   Physical Exam  Constitutional:  Moderately built and mildly obese in no acute distress  HENT:  Head: Atraumatic.  Eyes: Conjunctivae are normal.  Neck: Neck supple. No JVD present. No thyromegaly present.  Cardiovascular: Normal rate, regular rhythm, normal heart sounds and intact  distal pulses. Exam reveals no gallop.  No murmur heard. Pulmonary/Chest: Effort normal and breath sounds normal.  Abdominal: Soft. Bowel sounds are normal.  Musculoskeletal: Normal range of motion.  Neurological: She is alert.  Skin: Skin is warm and dry.  Psychiatric: She has a normal mood and affect.   Radiology: No results found.  Laboratory examination:   No results for input(s): NA, K, CL, CO2, GLUCOSE, BUN, CREATININE, CALCIUM, GFRNONAA, GFRAA in the last 8760 hours. No flowsheet data found. No flowsheet data found. Lipid Panel  No results found for: CHOL, TRIG, HDL, CHOLHDL, VLDL, LDLCALC, LDLDIRECT HEMOGLOBIN A1C No results found for: HGBA1C, MPG TSH No results for input(s): TSH in the last 8760 hours. Medications  No Known Allergies   Prior to Admission medications   Medication Sig Start Date End Date Taking? Authorizing Provider  fluticasone (FLONASE) 50 MCG/ACT nasal spray Place into both nostrils as needed for allergies or rhinitis.   Yes [provider]  levocetirizine (XYZAL) 5 MG tablet Take 5 mg by mouth as needed for allergies.   Yes [provider]  omeprazole (PRILOSEC) 40 MG capsule Take 40 mg by mouth daily.   Yes [provider]  PARoxetine (PAXIL) 10 MG tablet Take 37.5 mg by mouth daily.    Yes [provider]  TOPROL XL 25 MG 24 hr tablet  08/23/15  Yes [provider]  VITAMIN D, ERGOCALCIFEROL, PO Take 1 tablet by mouth daily.   Yes [provider]     Current Outpatient Medications  Medication Instructions  . fluticasone (FLONASE) 50 MCG/ACT nasal spray Each Nare, As needed  . levocetirizine (XYZAL) 5 mg, Oral, As needed  . omeprazole (PRILOSEC) 40 mg, Oral, Daily  . PARoxetine (PAXIL) 37.5 mg, Oral, Daily  . TOPROL XL 25 MG 24 hr tablet No dose, route, or frequency recorded.  VITAMIN D, ERGOCALCIFEROL, PO 1 tablet, Oral, Daily    Cardiac Studies:   None  Assessment     ICD-10-CM    1. PVC (premature ventricular contraction)  I49.3   2. Palpitations  R00.2 EKG 12-Lead  3. Gastroesophageal reflux disease, esophagitis presence not specified  K21.9   4. Sleep apnea, unspecified type  G47.30     EKG 09/21/2019: Normal sinus rhythm with rate of 66 bpm, normal axis.  Doing well from V1 to V3, cannot exclude anteroseptal ischemia.  Normal QT interval.  Low voltage complexes.  No significant change from 09/07/2019.  Recommendations:   Shelly Moran  is a 58 y.o. Caucasian female referred for evaluation of palpitations.  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 She has also had worsening "heart burn" as she has severe GERD.   Patient's symptoms are fairly typical for PVCs.  Since she is having at least 20-30 episodes during the day, each lasting a few seconds, I'll obtain a Holter monitor to evaluate frequency of PVCs.  Advised her to increase her Prilosec to b.i.d. dosing for the next one week to 2 weeks as GERD can induce PVCs.  She is also having excessive bloating and fullness in her stomach, may be related to issues with her CPAP, she has an appointment to see Dr. Brett Fairy which she can discuss with her regarding that.  In view of abnormal EKG and palpitations, I have recommended a routine treadmill exercise stress test along with an echocardiogram.  I'll see her back in 6 weeks for follow-up.  Adrian Prows, MD, [facility] 09/21/2019, 10:04 AM [facility] [place] Pager: [phone] Office: [phone] If no answer Cell [phone]

## 2019-09-22 DIAGNOSIS — R002 Palpitations: Secondary | ICD-10-CM

## 2019-09-26 ENCOUNTER — Institutional Professional Consult (permissible substitution): Admitting: Neurology

## 2019-09-28 ENCOUNTER — Encounter: Payer: Self-pay | Admitting: Neurology

## 2019-09-28 ENCOUNTER — Ambulatory Visit (INDEPENDENT_AMBULATORY_CARE_PROVIDER_SITE_OTHER): Admitting: Neurology

## 2019-09-28 ENCOUNTER — Other Ambulatory Visit: Payer: Self-pay

## 2019-09-28 VITALS — BP 160/82 | HR 64 | Temp 97.1°F | Ht 67.0 in | Wt 194.0 lb

## 2019-09-28 DIAGNOSIS — R002 Palpitations: Secondary | ICD-10-CM

## 2019-09-28 DIAGNOSIS — G4733 Obstructive sleep apnea (adult) (pediatric): Secondary | ICD-10-CM | POA: Diagnosis not present

## 2019-09-28 DIAGNOSIS — I493 Ventricular premature depolarization: Secondary | ICD-10-CM | POA: Diagnosis not present

## 2019-09-28 DIAGNOSIS — K219 Gastro-esophageal reflux disease without esophagitis: Secondary | ICD-10-CM | POA: Diagnosis not present

## 2019-09-28 DIAGNOSIS — Z9989 Dependence on other enabling machines and devices: Secondary | ICD-10-CM

## 2019-09-28 NOTE — Progress Notes (Signed)
SLEEP MEDICINE CLINIC    Provider:  Melvyn Novas, MD  Primary Care Physician:  Alysia Penna, MD [address] [place] [address]     Referring Provider: Alysia Penna, Md [address] [place],  [place] [address]          Chief Complaint according to patient   Patient presents with:     New Patient (Initial Visit)           HISTORY OF PRESENT ILLNESS:  Shelly Moran is a meanwhile  58 y.o. year old Caucasian female patient seen here on 09-28-2019  Upon a referral  from .Dr. Link Snuffer.   Chief concern according to patient : I need a new CPAP !~   I have the pleasure of seeing Shelly Moran today, a right-handed White or Caucasian female with a possible sleep disorder.  She  has a past medical history of GERD (gastroesophageal reflux disease) (09/21/2019), Palpitations (09/21/2019), PVC (premature ventricular contraction) (09/21/2019), and Sleep apnea (09/21/2019).Obesity. .   The patient had the first sleep study in the year 2014 at [facility] with a result of an AHI ( Apnea Hypopnea index) of 31.6/h , a RDI  ( Respiratory Disturbance Index) of 39.7 /h , an oxygen saturation Nadir at SP02 86 %.    Sleep relevant medical history: Shelly Moran is seen here today after a 6-year hiatus she had a sleep study at [facility] at the time performed in July 2014 and has used the same CPAP that was prescribed.  She reports that she has slept well after being placed on CPAP therapy her current machine which is the machine from 6 years ago is an elite ResMed model and set at 10 cmH2O pressure with 3 cm expiratory pressure relief she is 100% compliant patient and her residual AHI is 6.3.  The 95th percentile air leak is 11.7 L of water she has the occasional spike in the air leak causing her to have some unknown "" apneas.  It seems however that the vast majority of residual apneas 5.2/h, is related to obstructive events.  Based on her baseline polysomnography which showed severe  sleep apnea this is a very good resolution.  Since she is doing well with CPAP and sleeps well with CPAP is no doubt that she should continue having this device available but her machine would not longer be supported by the software.  Her Epworth Sleepiness Scale was endorsed at 5 and her fatigue severity score at 13 points both are low and normal, her past medical history now is positive for prediabetes, obstructive sleep apnea on CPAP,  PVCs acid reflux or GERD, and some anxiety.   She is status post foot surgery 2017, hysterectomy 2010 and tonsillectomy in the year 1966.  I would like to add that the patient had recently an attempt to go undergoes colonoscopy and endoscopy and her airway was considered too small for an outpatient procedure.    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 Husband retired from [employer].  Family status is married , with 3 adult children,all living with their parents.  The patient currently works daytime . Tobacco use;never .  ETOH use: none   Caffeine intake in form of Coffee( 1 cup in AM, sometimes PM) Soda( none) Tea ( occasionally )  No energy drinks. Regular exercise in form of walking, swimming. .   Hobbies : reading puzzles, swimming, needle point. ,    Sleep habits are as follows: The patient's dinner time is between 7 PM. The patient goes to bed at 11.30 PM and promptly asleep , she continues to sleep for 4 hours, wakes for one bathroom break, The preferred sleep position is on her right side , with the support of 2 pillows.  Dreams are reportedly frequent. 7.3- and 8 AM is the usual rise time. The patient wakes up with an alarm.  Rings 3 times.  She reports  feeling refreshed /restored in AM, without  symptoms such as dry mouth , morning headaches, and/or residual fatigue. Naps are taken  infrequently.    Review of Systems: Out of a complete 14 system review, the patient complains of only the following symptoms, and all other reviewed systems are negative.:  PVCs  Palpitations, on CPAP.   Dr Einar Gip, just wore a holter monitor. Echo pending.    How likely are you to doze in the following situations: 0 = not likely, 1 = slight chance, 2 = moderate chance, 3 = high chance   Sitting and Reading? Watching Television? Sitting inactive in a public place (theater or meeting)? As a passenger in a car for an hour without a break? Lying down in the afternoon when circumstances permit? Sitting and talking to someone? Sitting quietly after lunch without alcohol? In a car, while stopped for a few minutes in traffic?   Total = 5/ 24 points   FSS endorsed at 15/ 63 points.   Social History   Socioeconomic History   Marital status: Married    Spouse name: Not on file   Number of children: 3   Years of education: Not on file   Highest education level: Not on file  Occupational History   Not on file  Social Needs   Financial resource strain: Not on file   Food insecurity    Worry: Not on file    Inability: Not on file   Transportation needs    Medical: Not on file    Non-medical: Not on file  Tobacco Use   Smoking status: Never Smoker   Smokeless tobacco: Never Used  Substance and Sexual Activity   Alcohol use: Yes    Comment: beer on weekend   Drug use: Never   Sexual activity: Not on file  Lifestyle   Physical activity    Days per week: Not on file    Minutes per session: Not on file   Stress: Not on file  Relationships   Social connections    Talks on phone: Not on file    Gets together: Not on file    Attends religious service: Not on file    Active member of club or organization: Not on file    Attends meetings of clubs or organizations: Not on file    Relationship status: Not on file  Other Topics Concern   Not on file  Social  History Narrative   Not on file    Family History  Problem Relation Age of Onset   Hypertension Mother    Heart attack Father    Diabetes Mellitus II Father     Past Medical History:  Diagnosis Date   GERD (gastroesophageal reflux disease) 09/21/2019   Palpitations 09/21/2019   PVC (premature ventricular contraction) 09/21/2019   Sleep apnea 09/21/2019    Past Surgical History:  Procedure  Laterality Date   ENDOMETRIAL ABLATION     FOOT SURGERY  2017   HYSTEROSCOPY  2011     Current Outpatient Medications on File Prior to Visit  Medication Sig Dispense Refill   fluticasone (FLONASE) 50 MCG/ACT nasal spray Place into both nostrils as needed for allergies or rhinitis.     levocetirizine (XYZAL) 5 MG tablet Take 5 mg by mouth as needed for allergies.     omeprazole (PRILOSEC) 40 MG capsule Take 40 mg by mouth daily.     PARoxetine (PAXIL) 10 MG tablet Take 37.5 mg by mouth daily.      TOPROL XL 25 MG 24 hr tablet      VITAMIN D PO Take 2,000 Units by mouth daily.     No current facility-administered medications on file prior to visit.    Physical exam:  Today's Vitals   09/28/19 1449  BP: (!) 160/82  Pulse: 64  Temp: (!) 97.1 F (36.2 C)  Weight: 194 lb (88 kg)  Height: 5\' 7"  (1.702 m)   Body mass index is 30.38 kg/m.   Wt Readings from Last 3 Encounters:  09/28/19 194 lb (88 kg)  09/21/19 194 lb (88 kg)  10/22/15 174 lb (78.9 kg)     Ht Readings from Last 3 Encounters:  09/28/19 5\' 7"  (1.702 m)  09/21/19 5\' 7"  (1.702 m)  10/22/15 5\' 7"  (1.702 m)      General: The patient is awake, alert and appears not in acute distress. The patient is well groomed. Head: Normocephalic, atraumatic. Neck is supple. Mallampati 5,  neck circumference:16.25  inches . Nasal airflow congestion-    Retrognathia is  seen. Very crowded dental status. Dental status:  Cardiovascular:  Regular rate and cardiac rhythm by pulse,  without distended neck  veins. Respiratory: Lungs are clear to auscultation.  Skin:  Without evidence of ankle edema, or rash. Trunk: The patient's posture is erect.   Neurologic exam : The patient is awake and alert, oriented to place and time.   Memory subjective described as intact.  Attention span & concentration ability appears normal.  Speech is fluent,  without  dysarthria, dysphonia or aphasia.  Mood and affect are appropriate.   Cranial nerves: no loss of smell or taste reported  Pupils are equal and briskly reactive to light. Funduscopic exam deferred.  Extraocular movements in vertical and horizontal planes were intact and without nystagmus. No Diplopia. Visual fields by finger perimetry are intact. Hearing was intact to soft voice and finger rubbing.    Facial sensation intact to fine touch.  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  Neck ROM : rotation, tilt and flexion extension were normal for age and shoulder shrug was symmetrical.    Motor exam:  Symmetric bulk, tone and ROM.   Normal tone without cog wheeling, symmetric grip strength .   Sensory:  Fine touch, pinprick and vibration were tested  and  normal.  Proprioception tested in the upper extremities was normal.   Coordination: Rapid alternating movements in the fingers/hands were of normal speed.  The Finger-to-nose maneuver was intact without evidence of ataxia, dysmetria or tremor.   Gait and station: Patient could rise unassisted from a seated position, walked without assistive device.  Deep tendon reflexes: in the  upper and lower extremities are symmetric and intact.      After spending a total time of 40 minutes face to face and additional time for physical and  neurologic examination, review of laboratory studies,  personal review of imaging studies, reports and results of other testing and review of referral information / records as far as provided in visit, I have established  the following assessments:  1) Shelly Moran has a well-established history of obstructive sleep apnea and has been a compliant CPAP user for 6 years.  She has noted a significant improvement in sleep quality and daytime alertness since using CPAP.  She does have anatomical risk factors for ongoing obstructive sleep apnea including a crowded mandibular dental situation, functional macroglossia and narrow upper airway that was not visible without tongue depressor.  Neck size and BMI also elevated which poses additional risks.  In order to confirm that the patient still has apnea and to what degree I can order either a home sleep test or an attended sleep study, if the outcome is positive for sleep apnea as expected, I will order an auto titration CPAP for her.  She has done reasonably well on 10 cm water pressure but in order to reduce her residual AHI further she is likely needing higher pressures on this.  I like to for the evaluation to be performed before the end of October.  She also will need supplies, currently using a mask full facemask F and P, SIMPLUS medium.      My Plan is to proceed with:PSG split or HST-   I would like to thank Alysia Penna, MD  [address] [place],  [place] [address] for allowing me to meet with and to take care of this pleasant patient.   In short, Shelly Moran is presenting with Osa controlled on CPAP for 6 years. , Electronically signed by: Melvyn Novas, MD 09/28/2019 3:17 PM  [facility] Board certified by The [employer] of Sleep Medicine and Diplomate of the [employer] of Sleep Medicine. Board certified In Neurology through the ABPN, Fellow of the [employer] of Neurology. Medical Director of [facility].

## 2019-09-28 NOTE — Progress Notes (Signed)
Pt aware of results 

## 2019-09-28 NOTE — Patient Instructions (Signed)

## 2019-09-30 ENCOUNTER — Other Ambulatory Visit: Payer: Self-pay

## 2019-09-30 ENCOUNTER — Ambulatory Visit (INDEPENDENT_AMBULATORY_CARE_PROVIDER_SITE_OTHER)

## 2019-09-30 DIAGNOSIS — R002 Palpitations: Secondary | ICD-10-CM

## 2019-09-30 DIAGNOSIS — I493 Ventricular premature depolarization: Secondary | ICD-10-CM

## 2019-09-30 DIAGNOSIS — R9431 Abnormal electrocardiogram [ECG] [EKG]: Secondary | ICD-10-CM | POA: Diagnosis not present

## 2019-10-04 NOTE — Progress Notes (Signed)
Pt aware.

## 2019-10-04 NOTE — Progress Notes (Signed)
Called pt no answer, left a vm

## 2019-10-19 ENCOUNTER — Ambulatory Visit (INDEPENDENT_AMBULATORY_CARE_PROVIDER_SITE_OTHER)

## 2019-10-19 ENCOUNTER — Other Ambulatory Visit: Payer: Self-pay

## 2019-10-19 DIAGNOSIS — R9431 Abnormal electrocardiogram [ECG] [EKG]: Secondary | ICD-10-CM

## 2019-10-19 DIAGNOSIS — R002 Palpitations: Secondary | ICD-10-CM | POA: Diagnosis not present

## 2019-10-20 LAB — PCV CARDIAC STRESS TEST
Estimated workload: 8.9 METS
Exercise duration (min): 7 min
Exercise duration (sec): 14 s
MPHR: 90 {beats}/min
Peak HR: 145 {beats}/min
Rest HR: 79 {beats}/min

## 2019-11-02 ENCOUNTER — Encounter: Payer: Self-pay | Admitting: Cardiology

## 2019-11-02 ENCOUNTER — Other Ambulatory Visit: Payer: Self-pay

## 2019-11-02 ENCOUNTER — Ambulatory Visit (INDEPENDENT_AMBULATORY_CARE_PROVIDER_SITE_OTHER): Admitting: Cardiology

## 2019-11-02 VITALS — BP 162/75 | HR 76 | Temp 98.0°F | Ht 67.0 in | Wt 193.0 lb

## 2019-11-02 DIAGNOSIS — R002 Palpitations: Secondary | ICD-10-CM

## 2019-11-02 DIAGNOSIS — I493 Ventricular premature depolarization: Secondary | ICD-10-CM | POA: Diagnosis not present

## 2019-11-02 DIAGNOSIS — R7303 Prediabetes: Secondary | ICD-10-CM

## 2019-11-02 DIAGNOSIS — R072 Precordial pain: Secondary | ICD-10-CM

## 2019-11-02 DIAGNOSIS — E785 Hyperlipidemia, unspecified: Secondary | ICD-10-CM

## 2019-11-02 DIAGNOSIS — R9439 Abnormal result of other cardiovascular function study: Secondary | ICD-10-CM

## 2019-11-02 DIAGNOSIS — I10 Essential (primary) hypertension: Secondary | ICD-10-CM

## 2019-11-02 MED ORDER — METOPROLOL SUCCINATE ER 50 MG PO TB24: 50.0000 mg | ORAL_TABLET | Freq: Every day | ORAL | 3 refills | Status: AC

## 2019-11-02 MED ORDER — ATORVASTATIN CALCIUM 10 MG PO TABS: 10.0000 mg | ORAL_TABLET | Freq: Every day | ORAL | 2 refills | Status: DC

## 2019-11-02 NOTE — Progress Notes (Signed)
Primary Physician/Referring:  [name], MD  Patient ID: Shelly Moran, female    DOB: 04/22/1961, 58 y.o.   MRN: [id number]  Chief Complaint  Patient presents with  . Palpitations  . Follow-up    6 weeks  . Results   HPI:    [name]  is a 58 y.o. Caucasian female referred for evaluation of palpitations.  Patient with chronic palpitations, diagnosed with symptomatic PVC and on chronic metoprolol.  PMH significant for prediabetes, no h/o hypertension or hyperlipidemia referred to me for evaluation of worsening symptoms for the past 6 weeks. She has also had worsening "heart burn" as she has severe GERD.  She also has abnormal EKG on 08/03/2019 revealing T wave inversion in the anteroseptal leads.    Due to this, was referred to GI but could not pass the scope due to her neck anatomy and was very hypertensive. Seen by Verdia Kuba, MD.  I seen her on 09/28/2019, she underwent Holter monitor, treadmill stress test and echocardiogram and now presents for follow-up. States that since being on PPI, she has not had any further precordial pain, palpitations symptoms are improved.  She has started to watch her diet and has lost some weight as well.  Past Medical History:  Diagnosis Date  . GERD (gastroesophageal reflux disease) 09/21/2019  . Palpitations 09/21/2019  . PVC (premature ventricular contraction) 09/21/2019  . Sleep apnea 09/21/2019   Past Surgical History:  Procedure Laterality Date  . ENDOMETRIAL ABLATION    . FOOT SURGERY  2017  . HYSTEROSCOPY  2011   Social History   Socioeconomic History  . Marital status: Married    Spouse name: Not on file  . Number of children: 3  . Years of education: Not on file  . Highest education level: Not on file  Occupational History  . Not on file  Social Needs  . Financial resource strain: Not on file  . Food insecurity    Worry: Not on file    Inability: Not on file  . Transportation needs    Medical: Not on file   Non-medical: Not on file  Tobacco Use  . Smoking status: Never Smoker  . Smokeless tobacco: Never Used  Substance and Sexual Activity  . Alcohol use: Yes    Comment: beer on weekend  . Drug use: Never  . Sexual activity: Not on file  Lifestyle  . Physical activity    Days per week: Not on file    Minutes per session: Not on file  . Stress: Not on file  Relationships  . Social Herbalist on phone: Not on file    Gets together: Not on file    Attends religious service: Not on file    Active member of club or organization: Not on file    Attends meetings of clubs or organizations: Not on file    Relationship status: Not on file  . Intimate partner violence    Fear of current or ex partner: Not on file    Emotionally abused: Not on file    Physically abused: Not on file    Forced sexual activity: Not on file  Other Topics Concern  . Not on file  Social History Narrative  . Not on file   ROS  Review of Systems  Constitution: Negative for chills, decreased appetite, malaise/fatigue and weight gain.  Cardiovascular: Positive for palpitations. Negative for chest pain, dyspnea on exertion, leg swelling and syncope.  Respiratory: Positive for snoring (on CPAP).   Endocrine: Negative for cold intolerance.  Hematologic/Lymphatic: Does not bruise/bleed easily.  Musculoskeletal: Negative for joint swelling.  Gastrointestinal: Negative for abdominal pain, anorexia, change in bowel habit, hematochezia and melena.  Neurological: Negative for headaches and light-headedness.  Psychiatric/Behavioral: Negative for depression and substance abuse.  All other systems reviewed and are negative.  Objective   Vitals with BMI 11/02/2019 09/28/2019 09/21/2019  Height 5' 7"  5' 7"  5' 7"   Weight 193 lbs 194 lbs 194 lbs  BMI 30.22 41.03 01.31  Systolic 438 887 579  Diastolic 75 82 82  Pulse 76 64 68    Blood pressure (!) 162/75, pulse 76, temperature 98 F (36.7 C), height 5' 7"  (1.702  m), weight 193 lb (87.5 kg), SpO2 97 %. Body mass index is 30.23 kg/m.   Physical Exam  Constitutional:  Moderately built and mildly obese in no acute distress  HENT:  Head: Atraumatic.  Eyes: Conjunctivae are normal.  Neck: Neck supple. No JVD present. No thyromegaly present.  Cardiovascular: Normal rate, regular rhythm, normal heart sounds and intact distal pulses. Exam reveals no gallop.  No murmur heard. Pulmonary/Chest: Effort normal and breath sounds normal.  Abdominal: Soft. Bowel sounds are normal.  Musculoskeletal: Normal range of motion.  Neurological: She is alert.  Skin: Skin is warm and dry.  Psychiatric: She has a normal mood and affect.   Radiology: No results found.  Laboratory examination:   Labs 06/23/2019: Serum glucose 128 mg, BUN 15, creatinine 0.8, eGFR 73 mL, potassium 4.6.  CBC normal, platelets 169.  Total cholesterol 182, triglycerides 109, HDL 54, LDL 106.  Non-HDL cholesterol 128.  TSH normal.  A1c 6.4%.  No results for input(s): NA, K, CL, CO2, GLUCOSE, BUN, CREATININE, CALCIUM, GFRNONAA, GFRAA in the last 8760 hours. No flowsheet data found. No flowsheet data found. Lipid Panel  No results found for: CHOL, TRIG, HDL, CHOLHDL, VLDL, LDLCALC, LDLDIRECT HEMOGLOBIN A1C No results found for: HGBA1C, MPG TSH No results for input(s): TSH in the last 8760 hours. Medications  No Known Allergies   Prior to Admission medications   Medication Sig Start Date End Date Taking? Authorizing Provider  fluticasone (FLONASE) 50 MCG/ACT nasal spray Place into both nostrils as needed for allergies or rhinitis.   Yes [provider]  levocetirizine (XYZAL) 5 MG tablet Take 5 mg by mouth as needed for allergies.   Yes [provider]  omeprazole (PRILOSEC) 40 MG capsule Take 40 mg by mouth daily.   Yes [provider]  PARoxetine (PAXIL) 10 MG tablet Take 37.5 mg by mouth daily.    Yes [provider]  TOPROL XL 25 MG 24 hr tablet   08/23/15  Yes [provider]  VITAMIN D, ERGOCALCIFEROL, PO Take 1 tablet by mouth daily.   Yes [provider]     Current Outpatient Medications  Medication Instructions  . atorvastatin (LIPITOR) 10 mg, Oral, Daily  . fluticasone (FLONASE) 50 MCG/ACT nasal spray Each Nare, As needed  . levocetirizine (XYZAL) 5 mg, Oral, As needed  . metoprolol succinate (TOPROL-XL) 50 mg, Oral, Daily, Take with or immediately following a meal.  . omeprazole (PRILOSEC) 40 mg, Oral, Daily  . PARoxetine (PAXIL) 37.5 mg, Oral, Daily  . VITAMIN D PO 2,000 Units, Oral, Daily    Cardiac Studies:   Holter Monitor for 48 hours  09/21/2019: No symptoms repord. Occasional PVC and PAC noted. No atrial fibrillation or significant heart block.  Echocardiogram 09/30/2019: Left ventricle cavity is normal in size. Mild concentric hypertrophy of the left ventricle. Normal LV systolic function with visual EF 60-65%. Normal global wall motion. Normal diastolic filling pattern.  Mild (Grade I) mitral regurgitation. Mild tricuspid regurgitation. Small pericardial effusion. No evidence of pulmonary hypertension.  Treadmill Exercise Stress   10/20/19 The patient exercised following the Bruce protocol.  The patient experienced no angina during the stress test.  The test was stopped because the patient complained of fatigue.  Baseline ECG exhibits normal sinus rhythm.T-wave inversion in V1 to V3 . Stress EKG Horizontal ST segment depression ST segment depression of 2 mm was noted during stress in the II, III, aVF, V3, V4, V5 and V6 leads, beginning at 3 minutes of stress, ending at 2 minutes of stress, and returning to baseline after 2 minutes of recovery. rare PVCs.   Stress EKG is positive for myocardial Ischemia. Hypertensive blood pressure respon with peak blood pressure of: 240/90 mm.   Assessment     ICD-10-CM   1. Precordial pain  R07.2   2. Palpitations  R00.2 metoprolol succinate  (TOPROL-XL) 50 MG 24 hr tablet  3. PVC (premature ventricular contraction)  I49.3   4. Abnormal stress ECG with treadmill  R94.39 CT CORONARY MORPH W/CTA COR W/SCORE W/CA W/CM &/OR WO/CM    CT CORONARY FRACTIONAL FLOW RESERVE DATA PREP    CT CORONARY FRACTIONAL FLOW RESERVE FLUID ANALYSIS    atorvastatin (LIPITOR) 10 MG tablet  5. Primary hypertension  E33 Basic Metabolic Panel (BMET)    metoprolol succinate (TOPROL-XL) 50 MG 24 hr tablet  6. Prediabetes  R73.03   7. Mild hyperlipidemia  E78.5 atorvastatin (LIPITOR) 10 MG tablet    EKG 09/21/2019: Normal sinus rhythm with rate of 66 bpm, normal axis.  Doing well from V1 to V3, cannot exclude anteroseptal ischemia.  Normal QT interval.  Low voltage complexes.  No significant change from 09/07/2019.  Recommendations:   CHERONDA ERCK  is a 58 y.o. Caucasian female referred for evaluation of palpitations.  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 She has also had worsening "heart burn" as she has severe GERD.  I reviewed the results of the Holter monitor which shows occasional PACs and PVCs.  Echocardiogram essentially normal, insignificant valvular regurgitation were noted.  I am concerned about her hypertensive blood pressure response and marked EKG abnormalities noted on the treadmill stress test.  In view of her risk factors including hypertension, hyperlipidemia, would recommend proceeding with coronary CTA versus more angiography.  She continues to be hypertensive, will increase metoprolol succinate to 50 mg daily, will also add losartan 50 mg daily.  Schedule for cardiac catheterization, and possible angioplasty. We discussed regarding risks, benefits, alternatives to this including stress testing, CTA and continued medical  therapy. She prefers to have coronary CTA.   "Total time spent with patient was  40 minutes and greater than 50% of that time was spent in face to face discussion, counseling and coordination care"   Adrian Prows, MD, [facility] 11/02/2019, 10:29 PM [facility] [place] Pager: [phone] Office: [phone] If no answer Cell [phone]

## 2019-11-17 LAB — BASIC METABOLIC PANEL
BUN/Creatinine Ratio: 14 (ref 9–23)
BUN: 13 mg/dL (ref 6–24)
CO2: 22 mmol/L (ref 20–29)
Calcium: 9.7 mg/dL (ref 8.7–10.2)
Chloride: 105 mmol/L (ref 96–106)
Creatinine, Ser: 0.96 mg/dL (ref 0.57–1.00)
GFR calc Af Amer: 75 mL/min/{1.73_m2} (ref >59)
GFR calc non Af Amer: 65 mL/min/{1.73_m2} (ref >59)
Glucose: 177 mg/dL — ABNORMAL HIGH (ref 65–99)
Potassium: 4.4 mmol/L (ref 3.5–5.2)
Sodium: 143 mmol/L (ref 134–144)

## 2019-11-18 ENCOUNTER — Other Ambulatory Visit: Payer: Self-pay

## 2019-11-18 ENCOUNTER — Ambulatory Visit (INDEPENDENT_AMBULATORY_CARE_PROVIDER_SITE_OTHER): Admitting: Neurology

## 2019-11-18 DIAGNOSIS — G4733 Obstructive sleep apnea (adult) (pediatric): Secondary | ICD-10-CM

## 2019-11-18 DIAGNOSIS — I493 Ventricular premature depolarization: Secondary | ICD-10-CM

## 2019-11-18 DIAGNOSIS — Z9989 Dependence on other enabling machines and devices: Secondary | ICD-10-CM

## 2019-11-18 DIAGNOSIS — R002 Palpitations: Secondary | ICD-10-CM

## 2019-11-28 ENCOUNTER — Telehealth: Payer: Self-pay | Admitting: Neurology

## 2019-11-28 DIAGNOSIS — G4733 Obstructive sleep apnea (adult) (pediatric): Secondary | ICD-10-CM | POA: Insufficient documentation

## 2019-11-28 DIAGNOSIS — K219 Gastro-esophageal reflux disease without esophagitis: Secondary | ICD-10-CM | POA: Insufficient documentation

## 2019-11-28 NOTE — Procedures (Signed)
PATIENT'S NAME:  Shelly Moran, Shelly Moran DOB:      [birth year]      MR#:    [id number]     DATE OF RECORDING: 11/18/2019 REFERRING M.D.:  [name], MD Study Performed:   Baseline Polysomnogram HISTORY:  ALAHNA DUNNE was seen on 09-28-2019, and has a had a sleep study in 2014 at [facility], resulting in a AHI of 31.6/h, and has used CPAP ever since.  The patient endorsed the Epworth Sleepiness Scale at 5 points.   The patient's weight 194 pounds with a height of 67 (inches), resulting in a BMI of 30.4 kg/m2. The patient's neck circumference measured 16.2 inches.  CURRENT MEDICATIONS: Flonase, Xyzal, Prilosec, Paxil, Toprol XL, Vitamin D   PROCEDURE:  This is a multichannel digital polysomnogram utilizing the Somnostar 11.2 system.  Electrodes and sensors were applied and monitored per AASM Specifications.   EEG, EOG, Chin and Limb EMG, were sampled at 200 Hz.  ECG, Snore and Nasal Pressure, Thermal Airflow, Respiratory Effort, CPAP Flow and Pressure, Oximetry was sampled at 50 Hz. Digital video and audio were recorded.      BASELINE STUDY: Lights Out was at 22:08 and Lights On at 04:33.  Total recording time (TRT) was 385.5 minutes, with a total sleep time (TST) of 198 minutes.   The patient's sleep latency was 84.5 minutes.  The sleep efficiency was 51.4 %.     SLEEP ARCHITECTURE: WASO (Wake after sleep onset) was 102.5 minutes.  There were 58 minutes in Stage N1, 135.5 minutes Stage N2, 4.5 minutes Stage N3 and 0 minutes in Stage REM.  The percentage of Stage N1 was 29.3%, Stage N2 was 68.4%, Stage N3 was 2.3% and Stage R (REM sleep) was 0%.   RESPIRATORY ANALYSIS:  There were a total of 186 respiratory events:  52 obstructive apneas, 0 central apneas and 0 mixed apneas with a total of 52 apneas and an apnea index (AI) of 15.8 /hour. There were 134 hypopneas with a hypopnea index of 40.6 /hour. The patient also had 0 respiratory event related arousals (RERAs).     The total APNEA/HYPOPNEA INDEX (AHI) was  56.4 /hour and the total RESPIRATORY DISTURBANCE INDEX was 56.4 /hour, and a non-REM AHI of 56.4. The patient spent 0 minutes of total sleep time in the supine position and 198 minutes in non-supine The supine AHI was 0.0 versus a non-supine AHI of 56.4.  OXYGEN SATURATION & C02:  The Wake baseline 02 saturation was 96%, with the lowest being 79%. Time spent below 89% saturation equaled 25 minutes.   PERIODIC LIMB MOVEMENTS:  The patient had a total of 0 Periodic Limb Movements.   The Periodic Limb Movement (PLM) index was 0 and the PLM Arousal index was 0/hour. The arousals were noted as: 30 were spontaneous, 0 were associated with PLMs, 148 were associated with respiratory events.  Audio and video analysis did not show any abnormal or unusual movements, behaviors, phonations or vocalizations.      Mild- moderate Snoring was noted. EKG was in keeping with normal sinus rhythm (NSR).  IMPRESSION:  1. Severe Obstructive Sleep Apnea (OSA) was confirmed and requires CPAP therapy.    RECOMMENDATIONS:  Advise full-night, attended, CPAP titration study to optimize therapy.     I certify that I have reviewed the entire raw data recording prior to the issuance of this report in accordance with the Standards of Accreditation of the [employer] (AASM)   Larey Seat, MD [place], American  Board of Psychiatry and Neurology  Diplomat, Biomedical engineer of Sleep Medicine Wellsite geologist, [employer] [facility] at [employer]

## 2019-11-28 NOTE — Telephone Encounter (Signed)
I called pt. I advised pt that Dr. Brett Fairy reviewed their sleep study results and found that has severe sleep apnea and recommends that pt be treated with a cpap. Dr. Brett Fairy recommends that pt return for a repeat sleep study in order to properly titrate the cpap and ensure a good mask fit. Pt is agreeable to returning for a titration study. I advised pt that our sleep lab will file with pt's insurance and call pt to schedule the sleep study when we hear back from the pt's insurance regarding coverage of this sleep study. Pt verbalized understanding of results. Pt had no questions at this time but was encouraged to call back if questions arise.  Pt sleep efficiency was fairly poor for this study at 51%. She states that she was unable to get comfortable and she is used to using her CPAP which made harder for her to sleep without it.

## 2019-11-28 NOTE — Telephone Encounter (Signed)
-----   Message from Larey Seat, MD sent at 11/28/2019  1:26 PM EST ----- Mild- moderate Snoring was noted.  EKG was in keeping with normal sinus rhythm (NSR).   IMPRESSION:   1. Severe Obstructive Sleep Apnea (OSA) was confirmed and  requires CPAP therapy.  RECOMMENDATIONS: Advise full-night, attended, CPAP titration study to optimize  therapy.

## 2019-11-28 NOTE — Addendum Note (Signed)
Addended by: Larey Seat on: 11/28/2019 01:27 PM   Modules accepted: Orders

## 2019-11-29 ENCOUNTER — Telehealth: Payer: Self-pay | Admitting: Neurology

## 2019-11-30 ENCOUNTER — Telehealth: Payer: Self-pay

## 2019-12-02 ENCOUNTER — Telehealth (HOSPITAL_COMMUNITY): Payer: Self-pay | Admitting: Emergency Medicine

## 2019-12-02 NOTE — Telephone Encounter (Signed)
Reaching out to patient to offer assistance regarding upcoming cardiac imaging study; pt verbalizes understanding of appt date/time, parking situation and where to check in, pre-test NPO status and medications ordered, and verified current allergies; name and call back number provided for further questions should they arise Marchia Bond RN [place] [facility] [phone] office [phone] cell  Pt states she gets very nervous in clinical situations; pt also states she is a difficult IV start

## 2019-12-05 ENCOUNTER — Encounter (HOSPITAL_COMMUNITY): Payer: Self-pay

## 2019-12-05 ENCOUNTER — Other Ambulatory Visit: Payer: Self-pay

## 2019-12-05 ENCOUNTER — Ambulatory Visit (HOSPITAL_COMMUNITY)
Admission: RE | Admit: 2019-12-05 | Discharge: 2019-12-05 | Disposition: A | Discharge status: Home / Self Care | Source: Ambulatory Visit | Attending: Cardiology | Admitting: Cardiology

## 2019-12-05 DIAGNOSIS — R9430 Abnormal result of cardiovascular function study, unspecified: Secondary | ICD-10-CM

## 2019-12-05 DIAGNOSIS — R9439 Abnormal result of other cardiovascular function study: Secondary | ICD-10-CM

## 2019-12-05 MED ORDER — NITROGLYCERIN 0.4 MG SL SUBL
0.8000 mg | SUBLINGUAL_TABLET | SUBLINGUAL | Status: DC | PRN
  Administered 2019-12-05: 14:00:00 0.8 mg via SUBLINGUAL

## 2019-12-05 MED ORDER — NITROGLYCERIN 0.4 MG SL SUBL
SUBLINGUAL_TABLET | SUBLINGUAL | Status: AC
  Administered 2019-12-05: 0.8 mg via SUBLINGUAL
  Filled 2019-12-05: qty 2

## 2019-12-05 MED ORDER — IOHEXOL 350 MG/ML SOLN
80.0000 mL | Freq: Once | INTRAVENOUS | Status: AC | PRN
  Administered 2019-12-05: 80 mL via INTRAVENOUS

## 2019-12-08 NOTE — Telephone Encounter (Signed)
-----   Message from Adrian Prows, MD sent at 12/07/2019 11:50 PM EST ----- Normal coronary arteries and no evidence of any atherosclerosis. Excellent news.

## 2019-12-08 NOTE — Progress Notes (Signed)
Patient is aware 

## 2019-12-15 ENCOUNTER — Ambulatory Visit (INDEPENDENT_AMBULATORY_CARE_PROVIDER_SITE_OTHER): Admitting: Cardiology

## 2019-12-15 ENCOUNTER — Encounter: Payer: Self-pay | Admitting: Cardiology

## 2019-12-15 ENCOUNTER — Other Ambulatory Visit: Payer: Self-pay

## 2019-12-15 VITALS — BP 164/75 | HR 64 | Temp 97.4°F | Ht 67.0 in | Wt 192.3 lb

## 2019-12-15 DIAGNOSIS — R7303 Prediabetes: Secondary | ICD-10-CM

## 2019-12-15 DIAGNOSIS — G4733 Obstructive sleep apnea (adult) (pediatric): Secondary | ICD-10-CM | POA: Diagnosis not present

## 2019-12-15 DIAGNOSIS — R002 Palpitations: Secondary | ICD-10-CM

## 2019-12-15 DIAGNOSIS — I10 Essential (primary) hypertension: Secondary | ICD-10-CM

## 2019-12-15 DIAGNOSIS — Z9989 Dependence on other enabling machines and devices: Secondary | ICD-10-CM

## 2019-12-15 MED ORDER — LOSARTAN POTASSIUM-HCTZ 50-12.5 MG PO TABS: 1.0000 | ORAL_TABLET | ORAL | 2 refills | Status: AC

## 2019-12-15 NOTE — Progress Notes (Signed)
Primary Physician/Referring:  [name], MD  Patient ID: Shelly Moran, female    DOB: 1961-06-28, 58 y.o.   MRN: [id number]  Chief Complaint  Patient presents with  . Chest Pain  . Results    Coronary CTA  . Follow-up    6wk   HPI:    Shelly Moran  is a 58 y.o. Caucasian female With chronic palpitations, symptomatic PVCs, hypertension, diet-controlled diabetes, with weight loss A1c has improved to 6.4%, also seen for worsening "heartburn".  She also has severe GERD imporved on PPI.   Due to this, was referred to GI but could not pass the scope due to her neck anatomy and was very hypertensive. Seen by Verdia Kuba, MD.  Since being on PPI, she has not had any further precordial pain, palpitations symptoms are improved.  She underwent routine treadmill stress test on 10/20/2019 which was hypertensive and markedly abnormal EKG response is history of severe ischemia.  Hence he underwent coronary CTA on 12/05/2019 and presents for follow-up.She has not had any chest pain, she is trying her best to lose weight.  Past Medical History:  Diagnosis Date  . GERD (gastroesophageal reflux disease) 09/21/2019  . Palpitations 09/21/2019  . PVC (premature ventricular contraction) 09/21/2019  . Sleep apnea 09/21/2019   Past Surgical History:  Procedure Laterality Date  . ENDOMETRIAL ABLATION    . FOOT SURGERY  2017  . HYSTEROSCOPY  2011   Social History   Socioeconomic History  . Marital status: Married    Spouse name: Not on file  . Number of children: 3  . Years of education: Not on file  . Highest education level: Not on file  Occupational History  . Not on file  Tobacco Use  . Smoking status: Never Smoker  . Smokeless tobacco: Never Used  Substance and Sexual Activity  . Alcohol use: Yes    Comment: beer on weekend  . Drug use: Never  . Sexual activity: Not on file  Other Topics Concern  . Not on file  Social History Narrative  . Not on file   Social Determinants of  Health   Financial Resource Strain:   . Difficulty of Paying Living Expenses: Not on file  Food Insecurity:   . Worried About Charity fundraiser in the Last Year: Not on file  . Ran Out of Food in the Last Year: Not on file  Transportation Needs:   . Lack of Transportation (Medical): Not on file  . Lack of Transportation (Non-Medical): Not on file  Physical Activity:   . Days of Exercise per Week: Not on file  . Minutes of Exercise per Session: Not on file  Stress:   . Feeling of Stress : Not on file  Social Connections:   . Frequency of Communication with Friends and Family: Not on file  . Frequency of Social Gatherings with Friends and Family: Not on file  . Attends Religious Services: Not on file  . Active Member of Clubs or Organizations: Not on file  . Attends Archivist Meetings: Not on file  . Marital Status: Not on file  Intimate Partner Violence:   . Fear of Current or Ex-Partner: Not on file  . Emotionally Abused: Not on file  . Physically Abused: Not on file  . Sexually Abused: Not on file   ROS  Review of Systems  Constitution: Negative for chills, decreased appetite, malaise/fatigue and weight gain.  Cardiovascular: Positive for palpitations. Negative for chest  pain, dyspnea on exertion, leg swelling and syncope.  Respiratory: Positive for snoring (on CPAP).   Endocrine: Negative for cold intolerance.  Hematologic/Lymphatic: Does not bruise/bleed easily.  Musculoskeletal: Negative for joint swelling.  Gastrointestinal: Negative for abdominal pain, anorexia, change in bowel habit, hematochezia and melena.  Neurological: Negative for headaches and light-headedness.  Psychiatric/Behavioral: Negative for depression and substance abuse.  All other systems reviewed and are negative.  Objective   Vitals with BMI 12/15/2019 12/05/2019 12/05/2019  Height _0  - -  Weight 192 lbs 5 oz - -  BMI 40.08 - -  Systolic 676 195 -  Diastolic 75 66 -  Pulse 64 59  63    Blood pressure (!) 164/75, pulse 64, temperature (!) 97.4 F (36.3 C), height _1  (1.702 m), weight 192 lb 4.8 oz (87.2 kg), SpO2 98 %. Body mass index is 30.12 kg/m.   Physical Exam  Constitutional:  Moderately built and mildly obese in no acute distress  HENT:  Head: Atraumatic.  Eyes: Conjunctivae are normal.  Neck: No JVD present. No thyromegaly present.  Cardiovascular: Normal rate, regular rhythm, normal heart sounds and intact distal pulses. Exam reveals no gallop.  No murmur heard. Pulmonary/Chest: Effort normal and breath sounds normal.  Abdominal: Soft. Bowel sounds are normal.  Musculoskeletal:        General: Normal range of motion.     Cervical back: Neck supple.  Neurological: She is alert.  Skin: Skin is warm and dry.  Psychiatric: She has a normal mood and affect.   Radiology: No results found.  Laboratory examination:   Labs 06/23/2019: Serum glucose 128 mg, BUN 15, creatinine 0.8, eGFR 73 mL, potassium 4.6.  CBC normal, platelets 169.  Total cholesterol 182, triglycerides 109, HDL 54, LDL 106.  Non-HDL cholesterol 128.  TSH normal.  A1c 6.4%.   Recent Labs    11/16/19 1158  NA 143  K 4.4  CL 105  CO2 22  GLUCOSE 177*  BUN 13  CREATININE 0.96  CALCIUM 9.7  GFRNONAA 65  GFRAA 75   CMP Latest Ref Rng & Units 11/16/2019  Glucose 65 - 99 mg/dL 177(H)  BUN 6 - 24 mg/dL 13  Creatinine 0.57 - 1.00 mg/dL 0.96  Sodium 134 - 144 mmol/L 143  Potassium 3.5 - 5.2 mmol/L 4.4  Chloride 96 - 106 mmol/L 105  CO2 20 - 29 mmol/L 22  Calcium 8.7 - 10.2 mg/dL 9.7   Medications  No Known Allergies   Prior to Admission medications   Medication Sig Start Date End Date Taking? Authorizing Provider  fluticasone (FLONASE) 50 MCG/ACT nasal spray Place into both nostrils as needed for allergies or rhinitis.   Yes [provider]  levocetirizine (XYZAL) 5 MG tablet Take 5 mg by mouth as needed for allergies.   Yes [provider]  omeprazole  (PRILOSEC) 40 MG capsule Take 40 mg by mouth daily.   Yes [provider]  PARoxetine (PAXIL) 10 MG tablet Take 37.5 mg by mouth daily.    Yes [provider]  TOPROL XL 25 MG 24 hr tablet  08/23/15  Yes [provider]  VITAMIN D, ERGOCALCIFEROL, PO Take 1 tablet by mouth daily.   Yes [provider]     Current Outpatient Medications  Medication Instructions  . atorvastatin (LIPITOR) 10 mg, Oral, Daily  . fluticasone (FLONASE) 50 MCG/ACT nasal spray Each Nare, As needed  . levocetirizine (XYZAL) 5 mg, Oral, As needed  . losartan-hydrochlorothiazide (HYZAAR)  50-12.5 MG tablet 1 tablet, Oral, BH-each morning  . metoprolol succinate (TOPROL-XL) 50 mg, Oral, Daily, Take with or immediately following a meal.  . omeprazole (PRILOSEC) 40 mg, Oral, Daily  . PAROXETINE HCL PO 37.5 mg, Oral, Daily  . VITAMIN D PO 2,000 Units, Oral, Daily    Cardiac Studies:   Holter Monitor for 48 hours  09/21/2019: No symptoms repord. Occasional PVC and PAC noted. No atrial fibrillation or significant heart block.    Echocardiogram 09/30/2019: Left ventricle cavity is normal in size. Mild concentric hypertrophy of the left ventricle. Normal LV systolic function with visual EF 60-65%. Normal global wall motion. Normal diastolic filling pattern.  Mild (Grade I) mitral regurgitation. Mild tricuspid regurgitation. Small pericardial effusion. No evidence of pulmonary hypertension.  Treadmill Exercise Stress   10/20/19 The patient exercised following the Bruce protocol.  The patient experienced no angina during the stress test.  The test was stopped because the patient complained of fatigue.  Baseline ECG exhibits normal sinus rhythm.T-wave inversion in V1 to V3 . Stress EKG Horizontal ST segment depression ST segment depression of 2 mm was noted during stress in the II, III, aVF, V3, V4, V5 and V6 leads, beginning at 3 minutes of stress, ending at 2 minutes of stress, and  returning to baseline after 2 minutes of recovery. rare PVCs.   Stress EKG is positive for myocardial Ischemia. Hypertensive blood pressure respon with peak blood pressure of: 240/90 mm.    Coronary CTA 12/05/2019: 1. Coronary calcium score of 0. This was 0 percentile for age and  sex matched control. 2. Normal coronary origin with right dominance. 3. No evidence of CAD; CADRADS-0. 4. No significant incidental noncardiac findings are noted.  Assessment     ICD-10-CM   1. Primary hypertension  I10 losartan-hydrochlorothiazide (HYZAAR) 50-12.5 MG tablet  2. Palpitations  R00.2   3. Prediabetes  R73.03   4. OSA on CPAP  G47.33    Z99.89     EKG 09/21/2019: Normal sinus rhythm with rate of 66 bpm, normal axis.  Doing well from V1 to V3, cannot exclude anteroseptal ischemia.  Normal QT interval.  Low voltage complexes.  No significant change from 09/07/2019.  Recommendations:   Shelly Moran  is a 58 y.o. Caucasian female With chronic palpitations, symptomatic PVCs, hypertension, diet-controlled diabetes, with weight loss A1c has improved to 6.4%, also seen for worsening "heartburn".  She also has severe GERD imporved on PPI.  Advised her that she has 0 calcium score and no coronary artery disease by CTA, essentially a normal heart.  If she continues aggressive primary prevention strategy, she will do well without any future cardiovascular event risk.  Blood pressure is elevated today although blood pressure was controlled, but in view of prediabetes, she'll benefit from being on losartan HCT 50/12.5 mg in the morning.  Otherwise she is stable from cardiac standpoint.  I'll obtain a BMP in 2 weeks, I'll see her back on a p.r.n. basis.  She'll follow-up with Dr. Ardeth Perfect for future management.   Adrian Prows, MD, [facility] 12/15/2019, 4:19 PM [facility] [place] Pager: [phone] Office: [phone]  CC: Juanita Craver, MD; [name], MD

## 2019-12-31 ENCOUNTER — Other Ambulatory Visit (HOSPITAL_COMMUNITY): Payer: Self-pay

## 2020-01-02 ENCOUNTER — Other Ambulatory Visit (HOSPITAL_COMMUNITY)
Admission: RE | Admit: 2020-01-02 | Discharge: 2020-01-02 | Disposition: A | Discharge status: Home / Self Care | Source: Ambulatory Visit | Attending: Neurology | Admitting: Neurology

## 2020-01-02 ENCOUNTER — Other Ambulatory Visit: Payer: Self-pay | Admitting: Cardiology

## 2020-01-02 DIAGNOSIS — Z01812 Encounter for preprocedural laboratory examination: Secondary | ICD-10-CM | POA: Diagnosis present

## 2020-01-02 DIAGNOSIS — Z20822 Contact with and (suspected) exposure to covid-19: Secondary | ICD-10-CM | POA: Insufficient documentation

## 2020-01-02 LAB — SARS CORONAVIRUS 2 (TAT 6-24 HRS): SARS Coronavirus 2: NEGATIVE

## 2020-01-03 LAB — BASIC METABOLIC PANEL
BUN/Creatinine Ratio: 16 (ref 12–28)
BUN: 14 mg/dL (ref 8–27)
CO2: 27 mmol/L (ref 20–29)
Calcium: 9.6 mg/dL (ref 8.7–10.3)
Chloride: 99 mmol/L (ref 96–106)
Creatinine, Ser: 0.87 mg/dL (ref 0.57–1.00)
GFR calc Af Amer: 84 mL/min/{1.73_m2} (ref >59)
GFR calc non Af Amer: 73 mL/min/{1.73_m2} (ref >59)
Glucose: 130 mg/dL — ABNORMAL HIGH (ref 65–99)
Potassium: 4.6 mmol/L (ref 3.5–5.2)
Sodium: 140 mmol/L (ref 134–144)

## 2020-01-03 NOTE — Progress Notes (Signed)
Negative SARS Coronavirus 2 test

## 2020-01-04 ENCOUNTER — Ambulatory Visit (INDEPENDENT_AMBULATORY_CARE_PROVIDER_SITE_OTHER): Admitting: Neurology

## 2020-01-04 ENCOUNTER — Other Ambulatory Visit: Payer: Self-pay

## 2020-01-04 DIAGNOSIS — G4733 Obstructive sleep apnea (adult) (pediatric): Secondary | ICD-10-CM | POA: Diagnosis not present

## 2020-01-04 DIAGNOSIS — R002 Palpitations: Secondary | ICD-10-CM

## 2020-01-04 DIAGNOSIS — I493 Ventricular premature depolarization: Secondary | ICD-10-CM

## 2020-01-11 ENCOUNTER — Other Ambulatory Visit: Payer: Self-pay | Admitting: Obstetrics and Gynecology

## 2020-01-11 DIAGNOSIS — Z1231 Encounter for screening mammogram for malignant neoplasm of breast: Secondary | ICD-10-CM

## 2020-01-17 ENCOUNTER — Encounter: Payer: Self-pay | Admitting: Neurology

## 2020-01-20 NOTE — Addendum Note (Signed)
Addended by: Melvyn Novas on: 01/20/2020 05:58 PM   Modules accepted: Orders

## 2020-01-20 NOTE — Procedures (Signed)
PATIENT'S NAME:  Shelly Moran, Shelly Moran DOB:      [birth year]      MR#:    [id number]     DATE OF RECORDING: 01/04/2020  BH REFERRING M.D.:  Alysia Penna, MD Study Performed:   CPAP  Titration HISTORY:    CHELLA CHAPDELAINE has had a sleep study in 2014 at [facility], resulting in a AHI of 31.6/h, and has used CPAP with a FFM ever since. Repeat study was needed to permit prescription of new CPAP machine. This titration is following a new baseline study from 11-18-2019, which resulted in an AHI of 56.4/h. The diagnosis of Severe Sleep Apnea was confirmed.  The patient endorsed the Epworth Sleepiness Scale at 5 points.   The patient's weight 194 pounds with a height of 67 (inches), resulting in a BMI of 30.4 kg/m2. The patient's neck circumference measured 16.25 inches.  CURRENT MEDICATIONS: Flonase, Xyzal, Prilosec, Paxil, Toprol XL, Vitamin D   PROCEDURE:  This is a multichannel digital polysomnogram utilizing the SomnoStar 11.2 system.  Electrodes and sensors were applied and monitored per AASM Specifications.   EEG, EOG, Chin and Limb EMG, were sampled at 200 Hz.  ECG, Snore and Nasal Pressure, Thermal Airflow, Respiratory Effort, CPAP Flow and Pressure, Oximetry was sampled at 50 Hz. Digital video and audio were recorded.      The patient began the study at 6 cm water pressure, and the pressure was advanced to 14 cm water at final setting. AHI was 0.0/h She slept 164 minutes at 14 cm water with the use of an Simplus FFM in medium size.                                                        Lights Out was at 22:58 and Lights On at 05:10. Total recording time (TRT) was 372.5 minutes, with a total sleep time (TST) of 348 minutes. The patient's sleep latency was 10.5 minutes. REM latency was 206.5 minutes.  The sleep efficiency was 93.4 %.    SLEEP ARCHITECTURE: WASO (Wake after sleep onset) was at 19 minutes.  There were 24 minutes in Stage N1, 171 minutes Stage N2, 85 minutes Stage N3 and 68 minutes in Stage REM.   The percentage of Stage N1 was 6.9%, Stage N2 was 49.1%, Stage N3 was 24.4% and Stage R (REM sleep) was 19.5%.   RESPIRATORY ANALYSIS:  There was a total of 3 respiratory events: 0 obstructive apneas, 0 central apneas and 0 mixed apneas with a total of 0 apneas and an apnea index (AI) of 0 /hour. There were 3 hypopneas with a hypopnea index of .5/hour. The patient also had 1 respiratory event related arousals (RERAs).      The total APNEA/HYPOPNEA INDEX  (AHI) was .5 /hour and the total RESPIRATORY DISTURBANCE INDEX was .7 /hour  0 events occurred in REM sleep and 3 events in NREM. The REM AHI was 0 /hour versus a non-REM AHI of .6 /hour.  The patient spent 65.5 minutes of total sleep time in the supine position and 283 minutes in non-supine. The supine AHI was 0.0, versus a non-supine AHI of 0.6.  The baseline 02 saturation was 98%, with the lowest being 92%. Time spent below 89% saturation equaled 0 minutes. The arousals were noted as: 73 were spontaneous, 0 were associated  with PLMs, 1 was associated with respiratory events. The patient had a total of 0 Periodic Limb Movements.   Audio and video analysis did not show any abnormal or unusual movements, behaviors, phonations or vocalizations.   EKG was in keeping with normal sinus rhythm (NSR).  DIAGNOSIS 1. Severe Obstructive Sleep Apnea was controlled at CPAP setting of 14 cm water with the use of a Simplus FFM in medium size.  2. Snoring, Hypoxemia and sleep efficiency improved.   PLANS/RECOMMENDATIONS:  CPAP therapy will be continued with a newly ordered autotitration capable device, set between 7 and 17 cm water pressure with 2 cm EPR, heated humidity and Simplus mask in medium size.     DISCUSSION: RV with NP in 2-3 month after therapy.   A follow up appointment will be scheduled in the Sleep Clinic at [facility].   Please call [phone] with any questions.      I certify that I have reviewed the entire raw data  recording prior to the issuance of this report in accordance with the Standards of Accreditation of the American Academy of Sleep Medicine (AASM)   Larey Seat, M.D. Diplomat, Tax adviser of Psychiatry and Neurology  Diplomat, Tax adviser of Sleep Medicine Market researcher, [employer] [facility] at [employer]

## 2020-01-23 ENCOUNTER — Telehealth: Payer: Self-pay | Admitting: Neurology

## 2020-01-23 ENCOUNTER — Encounter: Payer: Self-pay | Admitting: Neurology

## 2020-01-23 NOTE — Telephone Encounter (Signed)
Called the patient and advised her of the titration study results. Advised a order will be sent to [facility] for a new machine. I have scheduled a follow up visit with the patient for march 25,2021 at 11:30 am with Butch Penny, NP. Patient already aware of CPAP compliance and had no further questions. Patient states she would like to be set up at [facility] office. I advised I would indicate that in my message.

## 2020-01-23 NOTE — Telephone Encounter (Signed)
-----   Message from Melvyn Novas, MD sent at 01/20/2020  5:58 PM EST ----- DIAGNOSIS  1. Severe Obstructive Sleep Apnea was controlled at CPAP setting  of 14 cm water with the use of a Simplus FFM in medium size.  2. Snoring, Hypoxemia and sleep efficiency improved.    PLANS/RECOMMENDATIONS: CPAP therapy will be continued with a  newly ordered autotitration capable device, set between 7 and 17  cm water pressure with 2 cm EPR, heated humidity and Simplus mask  in medium size.    DISCUSSION: RV with NP in 2-3 month after therapy.   A follow up appointment will be scheduled in the Sleep Clinic at  [facility].  Please call [phone] with  any questions.

## 2020-02-16 ENCOUNTER — Ambulatory Visit

## 2020-03-16 ENCOUNTER — Other Ambulatory Visit: Payer: Self-pay

## 2020-03-16 ENCOUNTER — Ambulatory Visit
Admission: RE | Admit: 2020-03-16 | Discharge: 2020-03-16 | Disposition: A | Discharge status: Home / Self Care | Source: Ambulatory Visit | Attending: Obstetrics and Gynecology | Admitting: Obstetrics and Gynecology

## 2020-03-16 DIAGNOSIS — Z1231 Encounter for screening mammogram for malignant neoplasm of breast: Secondary | ICD-10-CM

## 2020-03-22 ENCOUNTER — Ambulatory Visit (INDEPENDENT_AMBULATORY_CARE_PROVIDER_SITE_OTHER): Payer: Self-pay | Admitting: Adult Health

## 2020-03-22 ENCOUNTER — Other Ambulatory Visit: Payer: Self-pay

## 2020-03-22 VITALS — Temp 97.6°F | Ht 67.0 in | Wt 191.0 lb

## 2020-03-22 DIAGNOSIS — G4733 Obstructive sleep apnea (adult) (pediatric): Secondary | ICD-10-CM

## 2020-03-22 DIAGNOSIS — Z9989 Dependence on other enabling machines and devices: Secondary | ICD-10-CM

## 2020-03-22 NOTE — Patient Instructions (Addendum)
Continue using CPAP nightly and greater than 4 hours each night Change settings 7-15 Mask refitting If your symptoms worsen or you develop new symptoms please let us know.

## 2020-03-22 NOTE — Progress Notes (Signed)
PATIENT: Shelly Moran DOB: [birth year]  REASON FOR VISIT: follow up HISTORY FROM: patient  HISTORY OF PRESENT ILLNESS: Today 03/22/20:  Ms. Dearman is a 59 year old female with a history of obstructive sleep apnea on CPAP.  She returns today for follow-up.  Her download indicates that she used her machine nightly for compliance of 100%.  She used her machine greater than 4 hours each night.  On average she uses her machine 9 hours.  Her residual AHI is 2.1 on 7 to 17 cm of water with EPR 1.  Her leak in the 95th percentile is 18.3. she fills like her old mask/machine worked better for her. She feels the mask leaking. She also feels that the pressure may be too strong.  HISTORY: unable to see OFFICE note by Dr, Dohmeier since she marked the note sensitive.   REVIEW OF SYSTEMS: Out of a complete 14 system review of symptoms, the patient complains only of the following symptoms, and all other reviewed systems are negative.  FSS 24  ALLERGIES: No Known Allergies  HOME MEDICATIONS: Outpatient Medications Prior to Visit  Medication Sig Dispense Refill  . fluticasone (FLONASE) 50 MCG/ACT nasal spray Place into both nostrils as needed for allergies or rhinitis.    Marland Kitchen levocetirizine (XYZAL) 5 MG tablet Take 5 mg by mouth as needed for allergies.    Marland Kitchen losartan-hydrochlorothiazide (HYZAAR) 50-12.5 MG tablet Take 1 tablet by mouth every morning. 30 tablet 2  . metoprolol succinate (TOPROL-XL) 50 MG 24 hr tablet Take 1 tablet (50 mg total) by mouth daily. Take with or immediately following a meal. 90 tablet 3  . omeprazole (PRILOSEC) 40 MG capsule Take 40 mg by mouth daily.    Marland Kitchen PAROXETINE HCL PO Take 37.5 mg by mouth daily.     Marland Kitchen VITAMIN D PO Take 2,000 Units by mouth daily.     No facility-administered medications prior to visit.    PAST MEDICAL HISTORY: Past Medical History:  Diagnosis Date  . GERD (gastroesophageal reflux disease) 09/21/2019  . Palpitations 09/21/2019  . PVC  (premature ventricular contraction) 09/21/2019  . Sleep apnea 09/21/2019    PAST SURGICAL HISTORY: Past Surgical History:  Procedure Laterality Date  . ENDOMETRIAL ABLATION    . FOOT SURGERY  2017  . HYSTEROSCOPY  2011    FAMILY HISTORY: Family History  Problem Relation Age of Onset  . Hypertension Mother   . Heart attack Father   . Diabetes Mellitus II Father     SOCIAL HISTORY: Social History   Socioeconomic History  . Marital status: Married    Spouse name: Not on file  . Number of children: 3  . Years of education: Not on file  . Highest education level: Not on file  Occupational History  . Not on file  Tobacco Use  . Smoking status: Never Smoker  . Smokeless tobacco: Never Used  Substance and Sexual Activity  . Alcohol use: Yes    Comment: beer on weekend  . Drug use: Never  . Sexual activity: Not on file  Other Topics Concern  . Not on file  Social History Narrative  . Not on file   Social Determinants of Health   Financial Resource Strain:   . Difficulty of Paying Living Expenses:   Food Insecurity:   . Worried About Programme researcher, broadcasting/film/video in the Last Year:   . Barista in the Last Year:   Transportation Needs:   . Lack of Transportation (  Medical):   Marland Kitchen Lack of Transportation (Non-Medical):   Physical Activity:   . Days of Exercise per Week:   . Minutes of Exercise per Session:   Stress:   . Feeling of Stress :   Social Connections:   . Frequency of Communication with Friends and Family:   . Frequency of Social Gatherings with Friends and Family:   . Attends Religious Services:   . Active Member of Clubs or Organizations:   . Attends Archivist Meetings:   Marland Kitchen Marital Status:   Intimate Partner Violence:   . Fear of Current or Ex-Partner:   . Emotionally Abused:   Marland Kitchen Physically Abused:   . Sexually Abused:       PHYSICAL EXAM  Vitals:   03/22/20 1124  Temp: 97.6 F (36.4 C)  Weight: 191 lb (86.6 kg)  Height: 5\' 7"  (1.702  m)   Body mass index is 29.91 kg/m.  Generalized: Well developed, in no acute distress  Chest: Lungs clear to auscultation bilaterally  Neurological examination  Mentation: Alert oriented to time, place, history taking. Follows all commands speech and language fluent Cranial nerve II-XII: Extraocular movements were full, visual field were full on confrontational test Head turning and shoulder shrug  were normal and symmetric. Motor: The motor testing reveals 5 over 5 strength of all 4 extremities. Good symmetric motor tone is noted throughout.  Sensory: Sensory testing is intact to soft touch on all 4 extremities. No evidence of extinction is noted.  Gait and station: Gait is normal.    DIAGNOSTIC DATA (LABS, IMAGING, TESTING) - I reviewed patient records, labs, notes, testing and imaging myself where available.  No results found for: WBC, HGB, HCT, MCV, PLT    Component Value Date/Time   NA 140 01/02/2020 1535   K 4.6 01/02/2020 1535   CL 99 01/02/2020 1535   CO2 27 01/02/2020 1535   GLUCOSE 130 (H) 01/02/2020 1535   BUN 14 01/02/2020 1535   CREATININE 0.87 01/02/2020 1535   CALCIUM 9.6 01/02/2020 1535   GFRNONAA 73 01/02/2020 1535   GFRAA 84 01/02/2020 1535     ASSESSMENT AND PLAN 59 y.o. year old female  has a past medical history of GERD (gastroesophageal reflux disease) (09/21/2019), Palpitations (09/21/2019), PVC (premature ventricular contraction) (09/21/2019), and Sleep apnea (09/21/2019). here with:  1. OSA on CPAP  - CPAP compliance excellent - Good treatment of AHI  - mask refitting and decrease pressure 7-15 - Encourage patient to use CPAP nightly and > 4 hours each night - F/U in 6 months or sooner if needed   I spent 25 minutes of face-to-face and non-face-to-face time with patient.  This included previsit chart review, lab review, study review, order entry, electronic health record documentation, patient education.  Ward Givens, MSN, NP-C 03/22/2020,  11:23 AM [facility] [address], [place], [place] [address] [phone]

## 2020-03-22 NOTE — Progress Notes (Signed)
Messaged [facility] new cpap orders. Received. sy

## 2020-06-15 ENCOUNTER — Other Ambulatory Visit: Payer: Self-pay | Admitting: Gastroenterology

## 2020-06-15 DIAGNOSIS — R1310 Dysphagia, unspecified: Secondary | ICD-10-CM

## 2020-07-18 ENCOUNTER — Encounter: Payer: Self-pay | Admitting: Podiatry

## 2020-07-18 ENCOUNTER — Other Ambulatory Visit: Payer: Self-pay | Admitting: Podiatry

## 2020-07-18 ENCOUNTER — Ambulatory Visit (INDEPENDENT_AMBULATORY_CARE_PROVIDER_SITE_OTHER)

## 2020-07-18 ENCOUNTER — Other Ambulatory Visit: Payer: Self-pay

## 2020-07-18 ENCOUNTER — Ambulatory Visit (INDEPENDENT_AMBULATORY_CARE_PROVIDER_SITE_OTHER): Admitting: Podiatry

## 2020-07-18 VITALS — Temp 97.9°F

## 2020-07-18 DIAGNOSIS — M205X1 Other deformities of toe(s) (acquired), right foot: Secondary | ICD-10-CM

## 2020-07-18 DIAGNOSIS — M779 Enthesopathy, unspecified: Secondary | ICD-10-CM | POA: Diagnosis not present

## 2020-07-18 DIAGNOSIS — M79671 Pain in right foot: Secondary | ICD-10-CM | POA: Diagnosis not present

## 2020-07-19 NOTE — Progress Notes (Signed)
Subjective:   Patient ID: Shelly Moran, female   DOB: 59 y.o.   MRN: [id number]   HPI Patient presents stating that I am having the same problems with my right as I did on my left with the big toe joint and it is getting increasingly sore over the last year.  Patient likes to hike and be active and it is making it hard for her to do in the left wants done great after surgery 4 years ago   Review of Systems  All other systems reviewed and are negative.       Objective:  Physical Exam Vitals and nursing note reviewed.  Constitutional:      Appearance: She is well-developed.  Pulmonary:     Effort: Pulmonary effort is normal.  Musculoskeletal:        General: Normal range of motion.  Skin:    General: Skin is warm.  Neurological:     Mental Status: She is alert.     Neurovascular status intact muscle strength adequate range of motion within normal limits.  Patient is noted to have reduced range of motion with discomfort and mild crepitus of the first metatarsal phalangeal joint right with excellent range of motion left with no crepitus of the joint noted.  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  I did educate her on this and I discussed a plantarflexion with removal of spur and hopefully be able to save the cartilage even though ultimate fusion or implantation may be necessary.  Educated her on this and she wants surgery but needs to get her scheduled clear will call to schedule and be seen back by me before to go over everything in greater detail  X-rays indicate there is small spur dorsal first metatarsal and there is elevated first metatarsal segment with narrowing of the joint space.

## 2020-09-11 ENCOUNTER — Ambulatory Visit
Admission: RE | Admit: 2020-09-11 | Discharge: 2020-09-11 | Disposition: A | Discharge status: Home / Self Care | Source: Ambulatory Visit | Attending: Gastroenterology | Admitting: Gastroenterology

## 2020-09-11 DIAGNOSIS — R1310 Dysphagia, unspecified: Secondary | ICD-10-CM

## 2020-10-01 ENCOUNTER — Ambulatory Visit: Payer: Self-pay | Admitting: Adult Health

## 2020-10-17 ENCOUNTER — Ambulatory Visit (INDEPENDENT_AMBULATORY_CARE_PROVIDER_SITE_OTHER): Admitting: Podiatry

## 2020-10-17 ENCOUNTER — Other Ambulatory Visit: Payer: Self-pay

## 2020-10-17 DIAGNOSIS — M205X1 Other deformities of toe(s) (acquired), right foot: Secondary | ICD-10-CM

## 2020-10-17 NOTE — Progress Notes (Signed)
Subjective:   Patient ID: Shelly Moran, female   DOB: 59 y.o.   MRN: [id number]   HPI Patient presents stating she is ready to get this right foot fixed and wanted to ask about anesthesia   ROS      Objective:  Physical Exam  Neurovascular status intact negative [name]' sign was noted with patient's right first MPJ having significant reduction of motion of the joint and pain with movement.  Patient is found to have good digital perfusion well oriented x3 with enlargement of the joint surface and well-healed surgery on the left foot     Assessment:  Hallux limitus rigidus deformity right with previous surgery on the left that was done several years ago and successful     Plan:  H&P reviewed condition and x-ray and I recommended a decompression osteotomy with removal of dorsal spurring.  I explained procedure to patient and recovery and patient wants this done and after review signed consent form.  Patient scheduled outpatient surgery and all questions were answered and consent form signed.  She understands total recovery will take approximately 6 months with no long-term guarantees and ultimately could require fusion or joint implantation procedure.  She is scheduled for outpatient surgery in November and is encouraged to call with questions concerns

## 2020-11-12 MED ORDER — OXYCODONE-ACETAMINOPHEN 10-325 MG PO TABS: 1.0000 | ORAL_TABLET | ORAL | 0 refills | Status: DC | PRN

## 2020-11-12 MED ORDER — ONDANSETRON HCL 4 MG PO TABS: 4.0000 mg | ORAL_TABLET | Freq: Three times a day (TID) | ORAL | 0 refills | Status: DC | PRN

## 2020-11-12 NOTE — Addendum Note (Signed)
Addended by: Lenn Sink on: 11/12/2020 12:34 PM   Modules accepted: Orders

## 2020-11-13 ENCOUNTER — Encounter: Payer: Self-pay | Admitting: Podiatry

## 2020-11-19 ENCOUNTER — Encounter: Admitting: Podiatry

## 2020-12-10 ENCOUNTER — Encounter: Payer: Self-pay | Admitting: Adult Health

## 2020-12-10 ENCOUNTER — Ambulatory Visit: Admitting: Adult Health

## 2020-12-10 VITALS — BP 141/68 | HR 60 | Ht 67.0 in | Wt 197.0 lb

## 2020-12-10 DIAGNOSIS — G4733 Obstructive sleep apnea (adult) (pediatric): Secondary | ICD-10-CM | POA: Diagnosis not present

## 2020-12-10 DIAGNOSIS — Z9989 Dependence on other enabling machines and devices: Secondary | ICD-10-CM

## 2020-12-10 NOTE — Progress Notes (Signed)
PATIENT: Shelly Moran DOB: 1961-12-29  REASON FOR VISIT: follow up HISTORY FROM: patient  HISTORY OF PRESENT ILLNESS: Today 12/10/20: Shelly Moran is a 59 year old female with a history of obstructive sleep apnea on CPAP.  Her download indicates that she use her machine nightly for compliance of 100%.  She is averaging greater than 4 hours each night.  On average she uses her machine 8 hours and 28 minutes.  Her residual AHI is 2.7 on 7 to 15 cm of water.  Leak in the 95th percentile is 27.1 L/min she reports that CPAP works well for her.  She returns today for follow-up.  03/22/20:Shelly Moran is a 59 year old female with a history of obstructive sleep apnea on CPAP.  She returns today for follow-up.  Her download indicates that she used her machine nightly for compliance of 100%.  She used her machine greater than 4 hours each night.  On average she uses her machine 9 hours.  Her residual AHI is 2.1 on 7 to 17 cm of water with EPR 1.  Her leak in the 95th percentile is 18.3. she fills like her old mask/machine worked better for her. She feels the mask leaking. She also feels that the pressure may be too strong.  HISTORY: unable to see OFFICE note by Dr, Dohmeier since she marked the note sensitive.   REVIEW OF SYSTEMS: Out of a complete 14 system review of symptoms, the patient complains only of the following symptoms, and all other reviewed systems are negative.  ESS 3 FSS 13  ALLERGIES: No Known Allergies  HOME MEDICATIONS: Outpatient Medications Prior to Visit  Medication Sig Dispense Refill  . ALPRAZolam (XANAX) 0.5 MG tablet Take 0.5 mg by mouth 3 (three) times daily as needed.    . fluticasone (FLONASE) 50 MCG/ACT nasal spray Place into both nostrils as needed for allergies or rhinitis.    Marland Kitchen levocetirizine (XYZAL) 5 MG tablet Take 5 mg by mouth as needed for allergies.    Marland Kitchen losartan-hydrochlorothiazide (HYZAAR) 50-12.5 MG tablet Take 1 tablet by mouth every morning. 30 tablet 2   . metoprolol succinate (TOPROL-XL) 50 MG 24 hr tablet Take 1 tablet (50 mg total) by mouth daily. Take with or immediately following a meal. 90 tablet 3  . omeprazole (PRILOSEC) 40 MG capsule Take 40 mg by mouth daily.    . ondansetron (ZOFRAN) 4 MG tablet Take 1 tablet (4 mg total) by mouth every 8 (eight) hours as needed for nausea or vomiting. 20 tablet 0  . oxyCODONE-acetaminophen (PERCOCET) 10-325 MG tablet Take 1 tablet by mouth every 4 (four) hours as needed for pain. 25 tablet 0  . PAROXETINE HCL PO Take 37.5 mg by mouth daily.     . Probiotic Product (PROBIOTIC-10 PO) Probiotic    . VITAMIN D PO Take 2,000 Units by mouth daily.     No facility-administered medications prior to visit.    PAST MEDICAL HISTORY: Past Medical History:  Diagnosis Date  . GERD (gastroesophageal reflux disease) 09/21/2019  . Palpitations 09/21/2019  . PVC (premature ventricular contraction) 09/21/2019  . Sleep apnea 09/21/2019    PAST SURGICAL HISTORY: Past Surgical History:  Procedure Laterality Date  . ENDOMETRIAL ABLATION    . FOOT SURGERY  2017  . HYSTEROSCOPY  2011    FAMILY HISTORY: Family History  Problem Relation Age of Onset  . Hypertension Mother   . Heart attack Father   . Diabetes Mellitus II Father     SOCIAL  HISTORY: Social History   Socioeconomic History  . Marital status: Married    Spouse name: Not on file  . Number of children: 3  . Years of education: Not on file  . Highest education level: Not on file  Occupational History  . Not on file  Tobacco Use  . Smoking status: Never Smoker  . Smokeless tobacco: Never Used  Vaping Use  . Vaping Use: Never used  Substance and Sexual Activity  . Alcohol use: Yes    Comment: beer on weekend  . Drug use: Never  . Sexual activity: Not on file  Other Topics Concern  . Not on file  Social History Narrative  . Not on file   Social Determinants of Health   Financial Resource Strain: Not on file  Food Insecurity: Not  on file  Transportation Needs: Not on file  Physical Activity: Not on file  Stress: Not on file  Social Connections: Not on file  Intimate Partner Violence: Not on file      PHYSICAL EXAM  There were no vitals filed for this visit. There is no height or weight on file to calculate BMI.  Generalized: Well developed, in no acute distress  Chest: Lungs clear to auscultation bilaterally  Neurological examination  Mentation: Alert oriented to time, place, history taking. Follows all commands speech and language fluent Cranial nerve II-XII: Extraocular movements were full, visual field were full on confrontational test Head turning and shoulder shrug  were normal and symmetric. Motor: The motor testing reveals 5 over 5 strength of all 4 extremities. Good symmetric motor tone is noted throughout.  Sensory: Sensory testing is intact to soft touch on all 4 extremities. No evidence of extinction is noted.  Gait and station: Gait is normal.    DIAGNOSTIC DATA (LABS, IMAGING, TESTING) - I reviewed patient records, labs, notes, testing and imaging myself where available.      Component Value Date/Time   NA 140 01/02/2020 1535   K 4.6 01/02/2020 1535   CL 99 01/02/2020 1535   CO2 27 01/02/2020 1535   GLUCOSE 130 (H) 01/02/2020 1535   BUN 14 01/02/2020 1535   CREATININE 0.87 01/02/2020 1535   CALCIUM 9.6 01/02/2020 1535   GFRNONAA 73 01/02/2020 1535   GFRAA 84 01/02/2020 1535     ASSESSMENT AND PLAN 58 y.o. year old female  has a past medical history of GERD (gastroesophageal reflux disease) (09/21/2019), Palpitations (09/21/2019), PVC (premature ventricular contraction) (09/21/2019), and Sleep apnea (09/21/2019). here with:  1. OSA on CPAP  - CPAP compliance excellent - Good treatment of AHI  - Encourage patient to use CPAP nightly and > 4 hours each night - F/U in 6 months or sooner if needed   I spent 25 minutes of face-to-face and non-face-to-face time with patient.  This  included previsit chart review, lab review, study review, order entry, electronic health record documentation, patient education.  Butch Penny, MSN, NP-C 12/10/2020, 2:25 PM [facility] [address] [place], [place] [address] [phone]

## 2020-12-10 NOTE — Patient Instructions (Signed)

## 2020-12-31 MED ORDER — OXYCODONE-ACETAMINOPHEN 10-325 MG PO TABS
1.0000 | ORAL_TABLET | ORAL | 0 refills | Status: AC | PRN
Start: 2020-12-31 — End: ?

## 2020-12-31 MED ORDER — ONDANSETRON HCL 4 MG PO TABS: 4.0000 mg | ORAL_TABLET | Freq: Three times a day (TID) | ORAL | 0 refills | Status: AC | PRN

## 2020-12-31 NOTE — Addendum Note (Signed)
Addended by: Lenn Sink on: 12/31/2020 05:38 PM   Modules accepted: Orders

## 2021-01-01 DIAGNOSIS — M2011 Hallux valgus (acquired), right foot: Secondary | ICD-10-CM

## 2021-01-07 ENCOUNTER — Encounter: Payer: Self-pay | Admitting: Podiatry

## 2021-01-07 ENCOUNTER — Other Ambulatory Visit: Payer: Self-pay

## 2021-01-07 ENCOUNTER — Ambulatory Visit (INDEPENDENT_AMBULATORY_CARE_PROVIDER_SITE_OTHER)

## 2021-01-07 ENCOUNTER — Ambulatory Visit (INDEPENDENT_AMBULATORY_CARE_PROVIDER_SITE_OTHER): Admitting: Podiatry

## 2021-01-07 DIAGNOSIS — M205X1 Other deformities of toe(s) (acquired), right foot: Secondary | ICD-10-CM

## 2021-01-08 NOTE — Progress Notes (Signed)
Subjective:   Patient ID: Shelly Moran, female   DOB: 60 y.o.   MRN: [id number]   HPI Patient states doing really well with surgery and very pleased at this point no   ROS      Objective:  Physical Exam  Neurovascular status intact negative [name]' sign noted wound edges well coapted range of motion adequate first MPJ no crepitus of the joint with movement     Assessment:  Doing well post osteotomy right first metatarsal for hallux limitus     Plan:  H&P reviewed condition and reapplied sterile dressing instructed on continued elevation compression immobilization along with range of motion exercises and reappoint 3 weeks or earlier if needed  X-rays indicate the osteotomy is healing well with fixation in place joint looking healthy currently

## 2021-02-01 ENCOUNTER — Telehealth: Payer: Self-pay | Admitting: Podiatry

## 2021-02-01 NOTE — Telephone Encounter (Signed)
Pt called again stating someone from another office ordered doxycycline for her suture area. She sent a picture of her foot, and I scheduled her a sooner appt on Monday with Dr. Charlsie Merles at 1:45.

## 2021-02-01 NOTE — Telephone Encounter (Signed)
Pt called yesterday, stating she has a bright red spot formed around her sutures. I left her a voice message today, letting her know to soak her foot in soapy water for 10 minutes daily, per Dr. Charlsie Merles.

## 2021-02-04 ENCOUNTER — Other Ambulatory Visit: Payer: Self-pay

## 2021-02-04 ENCOUNTER — Ambulatory Visit (INDEPENDENT_AMBULATORY_CARE_PROVIDER_SITE_OTHER)

## 2021-02-04 ENCOUNTER — Encounter: Payer: Self-pay | Admitting: Podiatry

## 2021-02-04 ENCOUNTER — Ambulatory Visit (INDEPENDENT_AMBULATORY_CARE_PROVIDER_SITE_OTHER): Admitting: Podiatry

## 2021-02-04 DIAGNOSIS — M205X1 Other deformities of toe(s) (acquired), right foot: Secondary | ICD-10-CM | POA: Diagnosis not present

## 2021-02-05 NOTE — Progress Notes (Signed)
Subjective:   Patient ID: Shelly Moran, female   DOB: 60 y.o.   MRN: [id number]   HPI Patient presents stating that around the right first metatarsal there was some redness and wanted to get it checked and wanted motion checked and to see if she can get an x-ray today and not come in next week   ROS      Objective:  Physical Exam  Neurovascular status intact negative [name]' sign noted wound edge overall healing well slight redness distal portion of the incision localized no proximal edema erythema or drainage noted appears to be related to the suture that was placed in this area with slight reduction range of motion but adequate     Assessment:  Overall doing well with slight redness of the distal portion of the incision site right     Plan:  H&P reviewed condition recommended that I improve the range of motion which she will work on and I explained heel toe gait and I did carefully cleaned out the distal aspect incision with no active drainage noted.  I applied a Band-Aid patient will be seen back regular visit encouraged to call questions concerns  X-rays indicate osteotomies healing well fixation in place joint compressed but is rounded incongruence

## 2021-02-06 ENCOUNTER — Encounter: Admitting: Podiatry

## 2021-02-21 ENCOUNTER — Telehealth: Payer: Self-pay | Admitting: Podiatry

## 2021-02-21 NOTE — Telephone Encounter (Signed)
Pt states she may have a possible infection around part of her incision. I made her an appt. for tomorrow with Dr. Charlsie Merles at 9:15.

## 2021-02-22 ENCOUNTER — Ambulatory Visit (INDEPENDENT_AMBULATORY_CARE_PROVIDER_SITE_OTHER): Admitting: Podiatry

## 2021-02-22 ENCOUNTER — Ambulatory Visit (INDEPENDENT_AMBULATORY_CARE_PROVIDER_SITE_OTHER)

## 2021-02-22 ENCOUNTER — Other Ambulatory Visit: Payer: Self-pay

## 2021-02-22 DIAGNOSIS — L03119 Cellulitis of unspecified part of limb: Secondary | ICD-10-CM

## 2021-02-22 DIAGNOSIS — M205X1 Other deformities of toe(s) (acquired), right foot: Secondary | ICD-10-CM

## 2021-02-22 DIAGNOSIS — L02619 Cutaneous abscess of unspecified foot: Secondary | ICD-10-CM

## 2021-02-22 DIAGNOSIS — M79671 Pain in right foot: Secondary | ICD-10-CM

## 2021-02-22 MED ORDER — DOXYCYCLINE HYCLATE 100 MG PO TABS: 100.0000 mg | ORAL_TABLET | Freq: Two times a day (BID) | ORAL | 0 refills | Status: AC

## 2021-02-22 MED ORDER — DOXYCYCLINE HYCLATE 100 MG PO TABS: 100.0000 mg | ORAL_TABLET | Freq: Two times a day (BID) | ORAL | 0 refills | Status: DC

## 2021-02-23 NOTE — Progress Notes (Signed)
Subjective:   Patient ID: Shelly Moran, female   DOB: 60 y.o.   MRN: [id number]   HPI Patient presents stating that she was just concerned about some drainage she saw yesterday wanted to make sure it was okay   ROS      Objective:  Physical Exam  Neurovascular status intact with a small area in the proximal portion that is slightly red but it has improved quite nicely over the last day with soaks      Assessment:  Probability that this is related to the stitch spitting and I explained that to her and how that works.     Plan:  Reviewed condition with patient due to the fact that there is some redness around there and the second time it happened as a precautionary measure I did place her on doxycycline clean the area up did not note anywhere could culture this. She will be seen back if further redness occurs  X-rays indicate osteotomies healing well with some compression of the joint but clinically it is functioning much better than preoperatively with no pain

## 2021-03-11 ENCOUNTER — Encounter: Admitting: Podiatry

## 2021-03-27 ENCOUNTER — Other Ambulatory Visit: Payer: Self-pay

## 2021-03-27 ENCOUNTER — Ambulatory Visit (INDEPENDENT_AMBULATORY_CARE_PROVIDER_SITE_OTHER): Admitting: Podiatry

## 2021-03-27 ENCOUNTER — Ambulatory Visit (INDEPENDENT_AMBULATORY_CARE_PROVIDER_SITE_OTHER)

## 2021-03-27 ENCOUNTER — Encounter: Payer: Self-pay | Admitting: Podiatry

## 2021-03-27 DIAGNOSIS — M205X1 Other deformities of toe(s) (acquired), right foot: Secondary | ICD-10-CM

## 2021-03-27 NOTE — Progress Notes (Signed)
Subjective:   Patient ID: Shelly Moran, female   DOB: 60 y.o.   MRN: [id number]   HPI Patient states doing well with surgery but I have traumatized my foot several times and I want to make sure everything was okay   ROS      Objective:  Physical Exam  Neurovascular status intact negative [name]' sign is noted with patient's right first MPJ healing well wound edges well coapted no crepitus of the joint noted currently with good range of motion and good excursion     Assessment:  Overall appears to be doing well with some trauma but does not appear to cause pathology     Plan:  H&P x-ray reviewed and today I recommended the continuation of range of motion exercises anti-inflammatories physical therapy and rigid bottom shoes.  Patient will be seen back to recheck encouraged to call with questions  X-rays indicate good healing osteotomy the joint looks good no signs of pathology with fixation in place

## 2021-07-18 ENCOUNTER — Other Ambulatory Visit: Payer: Self-pay | Admitting: Internal Medicine

## 2021-07-18 DIAGNOSIS — R1011 Right upper quadrant pain: Secondary | ICD-10-CM

## 2021-07-19 ENCOUNTER — Ambulatory Visit
Admission: RE | Admit: 2021-07-19 | Discharge: 2021-07-19 | Disposition: A | Discharge status: Home / Self Care | Source: Ambulatory Visit | Attending: Internal Medicine | Admitting: Internal Medicine

## 2021-07-19 ENCOUNTER — Other Ambulatory Visit: Payer: Self-pay

## 2021-07-19 DIAGNOSIS — R1011 Right upper quadrant pain: Secondary | ICD-10-CM

## 2021-08-04 NOTE — Telephone Encounter (Signed)
Error, NC1

## 2021-08-26 ENCOUNTER — Ambulatory Visit: Payer: Self-pay | Admitting: General Surgery

## 2021-08-26 NOTE — H&P (Signed)
vSubjective   Chief Complaint: New Consultation (Gall bladder/)       History of Present Illness: Shelly Moran is a 60 y.o. female who is seen today as an office consultation at the request of Dr. Link Snuffer for evaluation of New Consultation (Gall bladder/) .     Patient is a 60 year old female, with a history of sleep apnea, PVCs, hypertension who comes in secondary to abdominal pain.  Patient states this been going on since March of this year.  She states that she has right upper quadrant pain that radiates to the back.  She states this is usually after high fatty meals.  She has had 3 or 4 different episodes.  She recently had a significant episode this past Friday after eating [employer].  Patient previously underwent an ultrasound revealed multiple gallstones in the gallbladder with largest measuring 4 cm.   Patient's had no previous abdominal surgery.         Review of Systems: A complete review of systems was obtained from the patient.  I have reviewed this information and discussed as appropriate with the patient.  See HPI as well for other ROS.   Review of Systems  Constitutional: Negative for fever.  HENT: Negative for congestion.   Eyes: Negative for blurred vision.  Respiratory: Negative for cough, shortness of breath and wheezing.   Cardiovascular: Negative for chest pain and palpitations.  Gastrointestinal: Negative for heartburn.  Genitourinary: Negative for dysuria.  Musculoskeletal: Negative for myalgias.  Skin: Negative for rash.  Neurological: Negative for dizziness and headaches.  Psychiatric/Behavioral: Negative for depression and suicidal ideas.  All other systems reviewed and are negative.       Medical History: Past Medical History Past Medical History: Diagnosis Date  Anxiety    GERD (gastroesophageal reflux disease)    Hypertension    Sleep apnea        There is no problem list on file for this patient.     Past Surgical History Past  Surgical History: Procedure Laterality Date  foot surgery      HYSTERECTOMY      TONSILLECTOMY          Allergies No Known Allergies    Current Outpatient Medications on File Prior to Visit Medication Sig Dispense Refill  albuterol 90 mcg/actuation inhaler ProAir HFA 90 mcg/actuation aerosol inhaler      ALPRAZolam (XANAX) 0.5 MG tablet alprazolam 0.5 mg tablet      azelastine (ASTELIN) 137 mcg nasal spray azelastine 137 mcg (0.1 %) nasal spray aerosol      cholecalciferol, vitamin D3, (CHOLECALCIFEROL, VIT D3,,BULK,) 100,000 unit/gram Powd Take by mouth      fluticasone propionate (FLONASE) 50 mcg/actuation nasal spray Place into one nostril      levocetirizine (XYZAL) 5 MG tablet levocetirizine 5 mg tablet      losartan-hydrochlorothiazide (HYZAAR) 50-12.5 mg tablet        metoprolol succinate (TOPROL-XL) 50 MG XL tablet Take 50 mg by mouth once daily      omeprazole (PRILOSEC) 40 MG DR capsule Take 40 mg by mouth every morning      PARoxetine (PAXIL) 10 MG tablet Take by mouth       No current facility-administered medications on file prior to visit.     Family History Family History Problem Relation Age of Onset  High blood pressure (Hypertension) Mother    Skin cancer Father    Hyperlipidemia (Elevated cholesterol) Father    Coronary Artery Disease (Blocked arteries around heart)  Father    Diabetes Father        Social History   Tobacco Use Smoking Status Never Smoker Smokeless Tobacco Never Used     Social History Social History    Socioeconomic History  Marital status: Married Tobacco Use  Smoking status: Never Smoker  Smokeless tobacco: Never Used Advertising account planner Use: Never used Substance and Sexual Activity  Alcohol use: Yes  Drug use: Never      Objective:     Vitals:   08/26/21 1343 BP: (!) 160/84 Pulse: 71 Temp: 36.6 C (97.8 F) SpO2: 98% Weight: 86.3 kg (190 lb 3.2 oz) Height: 170.2 cm (5\' 7" ) PainSc: 0-No pain   Body mass  index is 29.79 kg/m.   Physical Exam Constitutional:      Appearance: Normal appearance.  HENT:     Head: Normocephalic and atraumatic.     Mouth/Throat:     Mouth: Mucous membranes are moist.     Pharynx: Oropharynx is clear.  Eyes:     General: No scleral icterus.    Pupils: Pupils are equal, round, and reactive to light.  Cardiovascular:     Rate and Rhythm: Normal rate and regular rhythm.     Pulses: Normal pulses.     Heart sounds: No murmur heard.   No friction rub. No gallop.  Pulmonary:     Effort: Pulmonary effort is normal. No respiratory distress.     Breath sounds: Normal breath sounds. No stridor.  Abdominal:     General: Abdomen is flat.  Musculoskeletal:        General: No swelling.  Skin:    General: Skin is warm.  Neurological:     General: No focal deficit present.     Mental Status: She is alert and oriented to person, place, and time. Mental status is at baseline.  Psychiatric:        Mood and Affect: Mood normal.        Thought Content: Thought content normal.        Judgment: Judgment normal.        Assessment and Plan: Diagnoses and all orders for this visit:   Symptomatic cholelithiasis     Shelly Moran is a 60 y.o. female    1.  We will proceed to the OR for a lap cholecystectomy. 2. All risks and benefits were discussed with the patient to generally include: infection, bleeding, possible need for post op ERCP, damage to the bile ducts, and bile leak. Alternatives were offered and described.  All questions were answered and the patient voiced understanding of the procedure and wishes to proceed at this point with a laparoscopic cholecystectomy           No follow-ups on file.   77, MD, [facility], [facility] General & Minimally Invasive Surgery

## 2021-09-03 ENCOUNTER — Other Ambulatory Visit: Payer: Self-pay | Admitting: General Surgery

## 2021-12-09 NOTE — Progress Notes (Signed)
PATIENT: Shelly Moran DOB: 10-22-1961  REASON FOR VISIT: follow up HISTORY FROM: patient PRIMARY NEUROLOGIST:   Virtual Visit via Video Note  I connected with VASILIA DISE on 12/10/21 at  2:00 PM EST by a video enabled telemedicine application located remotely at [facility] and verified that I am speaking with the correct person using two identifiers who was located at their own home.   I discussed the limitations of evaluation and management by telemedicine and the availability of in person appointments. The patient expressed understanding and agreed to proceed.   PATIENT: Shelly Moran DOB: 01-06-1961  REASON FOR VISIT: follow up HISTORY FROM: patient  HISTORY OF PRESENT ILLNESS: Today 12/10/21:   Is a 60 year old female with a history of obstructive sleep apnea on CPAP.  She returns today for follow-up.  She reports that the CPAP is working well for her.  She does state at times she feels that the pressure is too strong.  She returns today for an evaluation.       REVIEW OF SYSTEMS: Out of a complete 14 system review of symptoms, the patient complains only of the following symptoms, and all other reviewed systems are negative.  ALLERGIES: No Known Allergies  HOME MEDICATIONS: Outpatient Medications Prior to Visit  Medication Sig Dispense Refill   ALPRAZolam (XANAX) 0.5 MG tablet Take 0.5 mg by mouth 3 (three) times daily as needed.     doxycycline (VIBRA-TABS) 100 MG tablet Take 1 tablet (100 mg total) by mouth 2 (two) times daily. 20 tablet 0   fluticasone (FLONASE) 50 MCG/ACT nasal spray Place into both nostrils as needed for allergies or rhinitis.     levocetirizine (XYZAL) 5 MG tablet Take 5 mg by mouth as needed for allergies.     losartan-hydrochlorothiazide (HYZAAR) 50-12.5 MG tablet Take 1 tablet by mouth every morning. 30 tablet 2   metoprolol succinate (TOPROL-XL) 50 MG 24 hr tablet Take 1 tablet (50 mg total) by mouth daily.  Take with or immediately following a meal. 90 tablet 3   omeprazole (PRILOSEC) 40 MG capsule Take 40 mg by mouth daily.     ondansetron (ZOFRAN) 4 MG tablet Take 1 tablet (4 mg total) by mouth every 8 (eight) hours as needed for nausea or vomiting. 20 tablet 0   oxyCODONE-acetaminophen (PERCOCET) 10-325 MG tablet Take 1 tablet by mouth every 4 (four) hours as needed for pain. 25 tablet 0   PAROXETINE HCL PO Take 37.5 mg by mouth daily.      VITAMIN D PO Take 2,000 Units by mouth daily.     No facility-administered medications prior to visit.    PAST MEDICAL HISTORY: Past Medical History:  Diagnosis Date   GERD (gastroesophageal reflux disease) 09/21/2019   Palpitations 09/21/2019   PVC (premature ventricular contraction) 09/21/2019   Sleep apnea 09/21/2019    PAST SURGICAL HISTORY: Past Surgical History:  Procedure Laterality Date   ENDOMETRIAL ABLATION     FOOT SURGERY  2017   HYSTEROSCOPY  2011    FAMILY HISTORY: Family History  Problem Relation Age of Onset   Hypertension Mother    Heart attack Father    Diabetes Mellitus II Father     SOCIAL HISTORY: Social History   Socioeconomic History   Marital status: Married    Spouse name: Not on file   Number of children: 3   Years of education: Not on file   Highest education level: Not on file  Occupational  History   Not on file  Tobacco Use   Smoking status: Never   Smokeless tobacco: Never  Vaping Use   Vaping Use: Never used  Substance and Sexual Activity   Alcohol use: Yes    Comment: beer on weekend   Drug use: Never   Sexual activity: Not on file  Other Topics Concern   Not on file  Social History Narrative   Not on file   Social Determinants of Health   Financial Resource Strain: Not on file  Food Insecurity: Not on file  Transportation Needs: Not on file  Physical Activity: Not on file  Stress: Not on file  Social Connections: Not on file  Intimate Partner Violence: Not on file      PHYSICAL  EXAM Generalized: Well developed, in no acute distress   Neurological examination  Mentation: Alert oriented to time, place, history taking. Follows all commands speech and language fluent Cranial nerve II-XII:Extraocular movements were full. Facial symmetry noted. Head turning and shoulder shrug  were normal and symmetric. Motor: Good strength throughout subjectively per patient Sensory: Sensory testing is intact to soft touch on all 4 extremities subjectively per patient Coordination: Cerebellar testing reveals good finger-nose-finger  Gait and station: Patient is able to stand from a seated position. gait is normal.  Reflexes: UTA  DIAGNOSTIC DATA (LABS, IMAGING, TESTING) - I reviewed patient records, labs, notes, testing and imaging myself where available.  No results found for: WBC, HGB, HCT, MCV, PLT    Component Value Date/Time   NA 140 01/02/2020 1535   K 4.6 01/02/2020 1535   CL 99 01/02/2020 1535   CO2 27 01/02/2020 1535   GLUCOSE 130 (H) 01/02/2020 1535   BUN 14 01/02/2020 1535   CREATININE 0.87 01/02/2020 1535   CALCIUM 9.6 01/02/2020 1535   GFRNONAA 73 01/02/2020 1535   GFRAA 84 01/02/2020 1535       ASSESSMENT AND PLAN 60 y.o. year old female  has a past medical history of GERD (gastroesophageal reflux disease) (09/21/2019), Palpitations (09/21/2019), PVC (premature ventricular contraction) (09/21/2019), and Sleep apnea (09/21/2019). here with:  OSA on CPAP  CPAP compliance excellent Residual AHI is good Will decrease pressure 7 to 14 cm water Encouraged patient to continue using CPAP nightly and > 4 hours each night F/U in 1 year or sooner if needed    Shelly Penny, MSN, NP-C 12/10/2021, 1:45 PM [facility] [address] [place], [place] [address] [phone]

## 2021-12-10 ENCOUNTER — Telehealth (INDEPENDENT_AMBULATORY_CARE_PROVIDER_SITE_OTHER): Admitting: Adult Health

## 2021-12-10 DIAGNOSIS — Z9989 Dependence on other enabling machines and devices: Secondary | ICD-10-CM | POA: Diagnosis not present

## 2021-12-10 DIAGNOSIS — G4733 Obstructive sleep apnea (adult) (pediatric): Secondary | ICD-10-CM

## 2021-12-12 NOTE — Progress Notes (Signed)
Ross Ludwig, RN; Dimas Millin got it      Previous Messages   ----- Message -----  From: Guy Begin, RN  Sent: 12/10/2021   2:03 PM EST  To: Wilford Sports, *  Subject: new orders                                     New order in epic for pt, Shelly Moran. Kle[place]i  Female, 60 y.o., 01-24-1961  MRN:  [id number]  Thank you, Andrey Campanile RN

## 2022-12-08 ENCOUNTER — Encounter: Payer: Self-pay | Admitting: *Deleted

## 2022-12-08 NOTE — Progress Notes (Signed)
PATIENT: Shelly Moran DOB: [birth year]  REASON FOR VISIT: follow up HISTORY FROM: patient PRIMARY NEUROLOGIST:   Virtual Visit via Video Note  I connected with Shelly Moran on 12/09/22 at  3:00 PM EST by a video enabled telemedicine application located remotely at [facility] and verified that I am speaking with the correct person using two identifiers who was located at their own home.   I discussed the limitations of evaluation and management by telemedicine and the availability of in person appointments. The patient expressed understanding and agreed to proceed.   PATIENT: Shelly Moran DOB: May 22, 1961  REASON FOR VISIT: follow up HISTORY FROM: patient  HISTORY OF PRESENT ILLNESS: Today 12/09/22:  Shelly Moran is a 61 year old female with a history of obstructive sleep apnea on CPAP.  She returns today for follow-up.  Her download is below.  She reports that the CPAP continues to work well for her.  She does report that she got a new puppy and the puppy is keeping her up at night.  Otherwise no other issues   12/10/21: Is a 60 year old female with a history of obstructive sleep apnea on CPAP.  She returns today for follow-up.  She reports that the CPAP is working well for her.  She does state at times she feels that the pressure is too strong.  She returns today for an evaluation.       REVIEW OF SYSTEMS: Out of a complete 14 system review of symptoms, the patient complains only of the following symptoms, and all other reviewed systems are negative.  ALLERGIES: No Known Allergies  HOME MEDICATIONS: Outpatient Medications Prior to Visit  Medication Sig Dispense Refill   ALPRAZolam (XANAX) 0.5 MG tablet Take 0.5 mg by mouth 3 (three) times daily as needed.     doxycycline (VIBRA-TABS) 100 MG tablet Take 1 tablet (100 mg total) by mouth 2 (two) times daily. 20 tablet 0   fluticasone (FLONASE) 50 MCG/ACT nasal spray Place into both nostrils as  needed for allergies or rhinitis.     levocetirizine (XYZAL) 5 MG tablet Take 5 mg by mouth as needed for allergies.     losartan-hydrochlorothiazide (HYZAAR) 50-12.5 MG tablet Take 1 tablet by mouth every morning. 30 tablet 2   metoprolol succinate (TOPROL-XL) 50 MG 24 hr tablet Take 1 tablet (50 mg total) by mouth daily. Take with or immediately following a meal. 90 tablet 3   omeprazole (PRILOSEC) 40 MG capsule Take 40 mg by mouth daily.     ondansetron (ZOFRAN) 4 MG tablet Take 1 tablet (4 mg total) by mouth every 8 (eight) hours as needed for nausea or vomiting. 20 tablet 0   oxyCODONE-acetaminophen (PERCOCET) 10-325 MG tablet Take 1 tablet by mouth every 4 (four) hours as needed for pain. 25 tablet 0   PAROXETINE HCL PO Take 37.5 mg by mouth daily.      VITAMIN D PO Take 2,000 Units by mouth daily.     No facility-administered medications prior to visit.    PAST MEDICAL HISTORY: Past Medical History:  Diagnosis Date   GERD (gastroesophageal reflux disease) 09/21/2019   Palpitations 09/21/2019   PVC (premature ventricular contraction) 09/21/2019   Sleep apnea 09/21/2019    PAST SURGICAL HISTORY: Past Surgical History:  Procedure Laterality Date   ENDOMETRIAL ABLATION     FOOT SURGERY  2017   HYSTEROSCOPY  2011    FAMILY HISTORY: Family History  Problem Relation Age of Onset  Hypertension Mother    Heart attack Father    Diabetes Mellitus II Father     SOCIAL HISTORY: Social History   Socioeconomic History   Marital status: Married    Spouse name: Not on file   Number of children: 3   Years of education: Not on file   Highest education level: Not on file  Occupational History   Not on file  Tobacco Use   Smoking status: Never   Smokeless tobacco: Never  Vaping Use   Vaping Use: Never used  Substance and Sexual Activity   Alcohol use: Yes    Comment: beer on weekend   Drug use: Never   Sexual activity: Not on file  Other Topics Concern   Not on file   Social History Narrative   Not on file   Social Determinants of Health   Financial Resource Strain: Not on file  Food Insecurity: Not on file  Transportation Needs: Not on file  Physical Activity: Not on file  Stress: Not on file  Social Connections: Not on file  Intimate Partner Violence: Not on file      PHYSICAL EXAM Generalized: Well developed, in no acute distress   Neurological examination  Mentation: Alert oriented to time, place, history taking. Follows all commands speech and language fluent Cranial nerve II-XII:Extraocular movements were full. Facial symmetry noted. Head turning and shoulder shrug  were normal and symmetric.  Reflexes: UTA  DIAGNOSTIC DATA (LABS, IMAGING, TESTING) - I reviewed patient records, labs, notes, testing and imaging myself where available.  No results found for: "WBC", "HGB", "HCT", "MCV", "PLT"    Component Value Date/Time   NA 140 01/02/2020 1535   K 4.6 01/02/2020 1535   CL 99 01/02/2020 1535   CO2 27 01/02/2020 1535   GLUCOSE 130 (H) 01/02/2020 1535   BUN 14 01/02/2020 1535   CREATININE 0.87 01/02/2020 1535   CALCIUM 9.6 01/02/2020 1535   GFRNONAA 73 01/02/2020 1535   GFRAA 84 01/02/2020 1535       ASSESSMENT AND PLAN 61 y.o. year old female  has a past medical history of GERD (gastroesophageal reflux disease) (09/21/2019), Palpitations (09/21/2019), PVC (premature ventricular contraction) (09/21/2019), and Sleep apnea (09/21/2019). here with:  OSA on CPAP  CPAP compliance excellent Residual AHI is good Encouraged patient to continue using CPAP nightly and > 4 hours each night F/U in 1 year or sooner if needed    Butch Penny, MSN, NP-C 12/09/2022, 3:04 PM [facility] [address] [place], [place] [address] [phone]

## 2022-12-09 ENCOUNTER — Telehealth (INDEPENDENT_AMBULATORY_CARE_PROVIDER_SITE_OTHER): Payer: Self-pay | Admitting: Adult Health

## 2022-12-09 DIAGNOSIS — Z9989 Dependence on other enabling machines and devices: Secondary | ICD-10-CM

## 2022-12-09 DIAGNOSIS — G4733 Obstructive sleep apnea (adult) (pediatric): Secondary | ICD-10-CM

## 2023-12-10 NOTE — Progress Notes (Signed)
PATIENT: Shelly Moran DOB: 06/24/1961  REASON FOR VISIT: follow up HISTORY FROM: patient PRIMARY NEUROLOGIST:   Virtual Visit via Video Note  I connected with Shelly Moran on 12/15/23 at  3:00 PM EST by a video enabled telemedicine application located remotely at [facility] and verified that I am speaking with the correct person using two identifiers who was located at their own home.   I discussed the limitations of evaluation and management by telemedicine and the availability of in person appointments. The patient expressed understanding and agreed to proceed.   PATIENT: Shelly Moran DOB: December 09, 1961  REASON FOR VISIT: follow up HISTORY FROM: patient  HISTORY OF PRESENT ILLNESS: Today 12/15/23:  Shelly Moran is a 62 y.o. female with a history of OSA on CPAP. Returns today for follow-up.  Reports that CPAP is working well for her.  She does state that her mouth is dry when she wakes up.  She has turned her humidification up to 6 but has not taken it away to 8.  Her download is below.       12/09/22: Shelly Moran is a 62 year old female with a history of obstructive sleep apnea on CPAP.  She returns today for follow-up.  Her download is below.  She reports that the CPAP continues to work well for her.  She does report that she got a new puppy and the puppy is keeping her up at night.  Otherwise no other issues   12/10/21: Is a 62 year old female with a history of obstructive sleep apnea on CPAP.  She returns today for follow-up.  She reports that the CPAP is working well for her.  She does state at times she feels that the pressure is too strong.  She returns today for an evaluation.       REVIEW OF SYSTEMS: Out of a complete 14 system review of symptoms, the patient complains only of the following symptoms, and all other reviewed systems are negative.  ALLERGIES: No Known Allergies  HOME MEDICATIONS: Outpatient Medications Prior to  Visit  Medication Sig Dispense Refill   ALPRAZolam (XANAX) 0.5 MG tablet Take 0.5 mg by mouth 3 (three) times daily as needed.     doxycycline (VIBRA-TABS) 100 MG tablet Take 1 tablet (100 mg total) by mouth 2 (two) times daily. 20 tablet 0   fluticasone (FLONASE) 50 MCG/ACT nasal spray Place into both nostrils as needed for allergies or rhinitis.     levocetirizine (XYZAL) 5 MG tablet Take 5 mg by mouth as needed for allergies.     losartan-hydrochlorothiazide (HYZAAR) 50-12.5 MG tablet Take 1 tablet by mouth every morning. 30 tablet 2   metoprolol succinate (TOPROL-XL) 50 MG 24 hr tablet Take 1 tablet (50 mg total) by mouth daily. Take with or immediately following a meal. 90 tablet 3   omeprazole (PRILOSEC) 40 MG capsule Take 40 mg by mouth daily.     ondansetron (ZOFRAN) 4 MG tablet Take 1 tablet (4 mg total) by mouth every 8 (eight) hours as needed for nausea or vomiting. 20 tablet 0   oxyCODONE-acetaminophen (PERCOCET) 10-325 MG tablet Take 1 tablet by mouth every 4 (four) hours as needed for pain. 25 tablet 0   PAROXETINE HCL PO Take 37.5 mg by mouth daily.      VITAMIN D PO Take 2,000 Units by mouth daily.     No facility-administered medications prior to visit.    PAST MEDICAL HISTORY: Past Medical History:  Diagnosis Date   GERD (gastroesophageal reflux disease) 09/21/2019   Palpitations 09/21/2019   PVC (premature ventricular contraction) 09/21/2019   Sleep apnea 09/21/2019    PAST SURGICAL HISTORY: Past Surgical History:  Procedure Laterality Date   ENDOMETRIAL ABLATION     FOOT SURGERY  2017   HYSTEROSCOPY  2011    FAMILY HISTORY: Family History  Problem Relation Age of Onset   Hypertension Mother    Heart attack Father    Diabetes Mellitus II Father     SOCIAL HISTORY: Social History   Socioeconomic History   Marital status: Married    Spouse name: Not on file   Number of children: 3   Years of education: Not on file   Highest education level: Not on file   Occupational History   Not on file  Tobacco Use   Smoking status: Never   Smokeless tobacco: Never  Vaping Use   Vaping status: Never Used  Substance and Sexual Activity   Alcohol use: Yes    Comment: beer on weekend   Drug use: Never   Sexual activity: Not on file  Other Topics Concern   Not on file  Social History Narrative   Not on file   Social Drivers of Health   Financial Resource Strain: Not on file  Food Insecurity: Not on file  Transportation Needs: Not on file  Physical Activity: Not on file  Stress: Not on file  Social Connections: Not on file  Intimate Partner Violence: Not on file      PHYSICAL EXAM Generalized: Well developed, in no acute distress   Neurological examination  Mentation: Alert oriented to time, place, history taking. Follows all commands speech and language fluent Cranial nerve II-XII: Facial symmetry noted DIAGNOSTIC DATA (LABS, IMAGING, TESTING) - I reviewed patient records, labs, notes, testing and imaging myself where available.  No results found for: "WBC", "HGB", "HCT", "MCV", "PLT"    Component Value Date/Time   NA 140 01/02/2020 1535   K 4.6 01/02/2020 1535   CL 99 01/02/2020 1535   CO2 27 01/02/2020 1535   GLUCOSE 130 (H) 01/02/2020 1535   BUN 14 01/02/2020 1535   CREATININE 0.87 01/02/2020 1535   CALCIUM 9.6 01/02/2020 1535   GFRNONAA 73 01/02/2020 1535   GFRAA 84 01/02/2020 1535       ASSESSMENT AND PLAN 62 y.o. year old female  has a past medical history of GERD (gastroesophageal reflux disease) (09/21/2019), Palpitations (09/21/2019), PVC (premature ventricular contraction) (09/21/2019), and Sleep apnea (09/21/2019). here with:  OSA on CPAP  CPAP compliance excellent Residual AHI is good Encouraged patient to continue using CPAP nightly and > 4 hours each night Advised patient to try turning her humidity up to 8 if this is still not helpful she may need a humidifier in her bedroom F/U in 1 year or sooner if  needed    Butch Penny, MSN, NP-C 12/15/2023, 2:22 PM [facility] [address] [place], [place] [address] [phone]

## 2023-12-15 ENCOUNTER — Telehealth: Admitting: Adult Health

## 2023-12-15 DIAGNOSIS — G4733 Obstructive sleep apnea (adult) (pediatric): Secondary | ICD-10-CM

## 2024-09-01 ENCOUNTER — Encounter: Payer: Self-pay | Admitting: Adult Health

## 2024-09-29 ENCOUNTER — Telehealth: Payer: Self-pay | Admitting: Adult Health

## 2024-09-29 DIAGNOSIS — G4733 Obstructive sleep apnea (adult) (pediatric): Secondary | ICD-10-CM

## 2024-09-29 NOTE — Telephone Encounter (Signed)
 Patient has an appt December and feels she is getting too much air when using the machine. She lost 40 pounds and feels she may not need it anymore and wanted to discuss options.

## 2024-10-03 ENCOUNTER — Other Ambulatory Visit: Payer: Self-pay | Admitting: *Deleted

## 2024-10-03 DIAGNOSIS — G4733 Obstructive sleep apnea (adult) (pediatric): Secondary | ICD-10-CM

## 2024-10-03 NOTE — Telephone Encounter (Signed)
 Called pt. Discussed message from [facility] NP. Pt agreed to HST if insurance will cover. If not, she is ok with wait list for sooner appt. She thanked me for the call.

## 2024-10-03 NOTE — Telephone Encounter (Signed)
 Shelly Moran

## 2024-10-03 NOTE — Telephone Encounter (Signed)
 It appears she is needed 13 cm of pressure. Not sure that I would adjust the pressure. If she has lost 40lbs we could repeat HST or put her on waitlist for sooner visit and I can discuss with her then.

## 2024-10-24 ENCOUNTER — Ambulatory Visit (INDEPENDENT_AMBULATORY_CARE_PROVIDER_SITE_OTHER): Admitting: Neurology

## 2024-10-24 DIAGNOSIS — G4733 Obstructive sleep apnea (adult) (pediatric): Secondary | ICD-10-CM

## 2024-11-01 NOTE — Progress Notes (Signed)
 [facility]   HOME SLEEP TEST REPORT ( by Elene  mail -out device )   Shelly Moran 64 year old female 1961/01/26  Study Protocol:     The SANSA chest-worn sensor - an FDA cleared and DOT approved type 4 home sleep test device - measures eight physiological channels,  including blood oxygen saturation (measured via PPG [photoplethysmography]), EKG-derived heart rate, respiratory effort, chest movement (measured via accelerometer), snoring, body position, and actigraphy. The device is designed to be worn for up to 10 hours per study.    STUDY DATE:10-24-2024 Data received :  11-01-2024     ORDERING CLINICIAN: Duwaine Russell, NP  REFERRING CLINICIAN:   Dr Larnell, MD and PCP     CLINICAL INFORMATION/HISTORY: Duwaine Russell, NP ordered this HST for a patient who is already established on CPAP in the therapy of OSA.  63 year-old female who has a past medical history of GERD (gastroesophageal reflux disease) (09/21/2019), Palpitations (09/21/2019), PVC (premature ventricular contraction) (09/21/2019), and Sleep apnea (09/21/2019).  Last face to face 2021. Has followed yearly by Video visit and qualifies for a new CPAP.  Her download indicates that she used her machine nightly for compliance of 100%.  She used her machine greater than 4 hours each night.  On average she uses her machine 9 hours.  Her residual AHI is 2.1 on 7 to 17 cm of water with EPR 1.  Her leak in the 95th percentile is 18.3. she fills like her old mask/machine worked better for her. She feels the mask leaking. She also feels that the pressure may be too strong.  Uses 7-14 cm water pressure and archived a residual AHI of 3.6/h  with 100% compliance.      Epworth sleepiness score: X /24. FFS at   X/ 63 points   BMI:X kg/m  ( patient lost 40 pounds since last sleep test )  Neck Circumference: X   FINDINGS:  Sleep Summary:   Start Recording Time (hours, min):   10-24-24 at  23:45 minutes       Total  Sleep Time (hours, min):  8 h 22 minutes    Sleep efficiency %;   85%                                    Respiratory Indices:   by AASM  criteria of scoring;    Calculated pAHI (per hour):       11.9/h  ( by CMS criteria this would be only 4.7/h )                                           Positional  respiratory activity  / snoring : no significant snoring noted.    Sleep in prone and supine position was recorded. Remarkably, prone sleep was associated with a higher apnea /hypopnea Index (AHI)  than supine sleep.   Oxygen Saturation  in Sleep    Oxygen Saturation (%) Mean:     95%, between 82.8% and 99.4%  O2 Saturation (minutes) <89%:      0 minutes     Pulse Rate in Sleep :   Pulse Mean (bpm):   72 bpm , between  60- 102 bpm . Regular rhythm, presumed NSR.                IMPRESSION:  This HST confirms the presence of mild obstructive sleep apnea, the total AHI ( apnea Hypopnea Index) was 11.9/h.  Remarkably, prone sleep was associated with a higher apnea /hypopnea Index (AHI was 15/h)  than supine sleep ( AHI circa 5/h) .     RECOMMENDATION:  I like for the patient to continue CPAP treatment if she is willing to do so.  Her reported weight loss has certainly left her with other options:  It is unusual to see more apnea activity in prone sleep than supine, but this offers an alternative treatment option : avoiding prone sleep.    If [name] is planning to continue CPAP therapy, please issue a DME order to cover the last 95% pressure needed per data download:  Alternatively , I suggest 5 through 13 cm water setting, 1 cm EPR and interface of patient's comfort and choice. As a non-snoring patient, she has choices :  a nasal pillow or cradle type interface can be used.     Any Patient endorsing a high level of sleepiness should be cautioned not to drive, work at heights, or operate dangerous machinery or heavy equipment when tired or  sleepy.  Review of good sleep hygiene measures took place in the initial consultation but should be revisited ( Your guide to better sleep  a publication by the NIH is a good source of information).   The referring provider will be notified of the test results.    I certify that I have reviewed the raw data recording prior to the issuance of this report in accordance with the standards of the American Academy of Sleep Medicine (AASM).    INTERPRETING PHYSICIAN:   Dedra Gores, MD  [facility] and [facility] Sleep Board certified by [employer] and Diplomate of the [employer]. Board certified In Neurology through the ABPN, Fellow of the [employer].

## 2024-11-10 ENCOUNTER — Encounter: Payer: Self-pay | Admitting: Adult Health

## 2024-11-12 NOTE — Procedures (Signed)
 [facility]   HOME SLEEP TEST REPORT ( by Elene  mail -out device )   Shelly Moran 63 year old female 08/27/1961  Study Protocol:     The SANSA chest-worn sensor - an FDA cleared and DOT approved type 4 home sleep test device - measures eight physiological channels,  including blood oxygen saturation (measured via PPG [photoplethysmography]), EKG-derived heart rate, respiratory effort, chest movement (measured via accelerometer), snoring, body position, and actigraphy. The device is designed to be worn for up to 10 hours per study.    STUDY DATE:10-24-2024 Data received :  11-01-2024     ORDERING CLINICIAN: Duwaine Russell, NP  REFERRING CLINICIAN:   Dr Larnell, MD and PCP     CLINICAL INFORMATION/HISTORY: Duwaine Russell, NP ordered this HST for a patient who is already established on CPAP in the therapy of OSA.  63 year-old female who has a past medical history of GERD (gastroesophageal reflux disease) (09/21/2019), Palpitations (09/21/2019), PVC (premature ventricular contraction) (09/21/2019), and Sleep apnea (09/21/2019).  Last face to face 2021. Has followed yearly by Video visit and qualifies for a new CPAP.  Her download indicates that she used her machine nightly for compliance of 100%.  She used her machine greater than 4 hours each night.  On average she uses her machine 9 hours.  Her residual AHI is 2.1 on 7 to 17 cm of water with EPR 1.  Her leak in the 95th percentile is 18.3. she fills like her old mask/machine worked better for her. She feels the mask leaking. She also feels that the pressure may be too strong.  Uses 7-14 cm water pressure and archived a residual AHI of 3.6/h  with 100% compliance.      Epworth sleepiness score: X /24. FFS at   X/ 63 points   BMI:X kg/m  ( patient lost 40 pounds since last sleep test )  Neck Circumference: X   FINDINGS:  Sleep Summary:   Start Recording Time (hours, min):   10-24-24 at  23:45 minutes       Total  Sleep Time (hours, min):  8 h 22 minutes    Sleep efficiency %;   85%                                    Respiratory Indices:   by AASM  criteria of scoring;    Calculated pAHI (per hour):       11.9/h  ( by CMS criteria this would be only 4.7/h )                                           Positional  respiratory activity  / snoring : no significant snoring noted.    Sleep in prone and supine position was recorded. Remarkably, prone sleep was associated with a higher apnea /hypopnea Index (AHI)  than supine sleep.   Oxygen Saturation  in Sleep    Oxygen Saturation (%) Mean:     95%, between 82.8% and 99.4%  O2 Saturation (minutes) <89%:      0 minutes     Pulse Rate in Sleep :   Pulse Mean (bpm):   72 bpm , between  60- 102 bpm . Regular rhythm, presumed NSR.                IMPRESSION:  This HST confirms the presence of mild obstructive sleep apnea, the total AHI ( apnea Hypopnea Index) was 11.9/h.  Remarkably, prone sleep was associated with a higher apnea /hypopnea Index (AHI was 15/h)  than supine sleep ( AHI circa 5/h) .     RECOMMENDATION:  I like for the patient to continue CPAP treatment if she is willing to do so.  Her reported weight loss has certainly left her with other options:  It is unusual to see more apnea activity in prone sleep than supine, but this offers an alternative treatment option : avoiding prone sleep.    If [name] is planning to continue CPAP therapy, please issue a DME order to cover the last 95% pressure needed per data download:  Alternatively , I suggest 5 through 13 cm water setting, 1 cm EPR and interface of patient's comfort and choice. As a non-snoring patient, she has choices :  a nasal pillow or cradle type interface can be used.     Any Patient endorsing a high level of sleepiness should be cautioned not to drive, work at heights, or operate dangerous machinery or heavy equipment when tired or  sleepy.  Review of good sleep hygiene measures took place in the initial consultation but should be revisited ( Your guide to better sleep  a publication by the NIH is a good source of information).   The referring provider will be notified of the test results.    I certify that I have reviewed the raw data recording prior to the issuance of this report in accordance with the standards of the American Academy of Sleep Medicine (AASM).    INTERPRETING PHYSICIAN:   Dedra Gores, MD  [facility] and [facility] Sleep Board certified by [employer] and Diplomate of the [employer]. Board certified In Neurology through the ABPN, Fellow of the [employer].

## 2024-11-14 ENCOUNTER — Ambulatory Visit: Payer: Self-pay | Admitting: Adult Health

## 2024-11-14 DIAGNOSIS — G4733 Obstructive sleep apnea (adult) (pediatric): Secondary | ICD-10-CM

## 2024-12-15 NOTE — Progress Notes (Deleted)
 SABRA

## 2024-12-19 ENCOUNTER — Ambulatory Visit: Admitting: Adult Health

## 2024-12-20 ENCOUNTER — Telehealth: Admitting: Adult Health

## 2025-01-20 ENCOUNTER — Ambulatory Visit: Admitting: Adult Health

## 2025-03-07 ENCOUNTER — Telehealth: Admitting: Adult Health
